# Patient Record
Sex: Female | Born: 1945
Health system: Western US, Academic
[De-identification: ages and names within clinical notes are randomized; demographics above are authoritative.]

---

## 2016-04-12 DIAGNOSIS — H15849 Scleral ectasia, unspecified eye: Secondary | ICD-10-CM

## 2016-04-12 DIAGNOSIS — Z9889 Other specified postprocedural states: Secondary | ICD-10-CM

## 2016-09-04 DIAGNOSIS — H159 Unspecified disorder of sclera: Secondary | ICD-10-CM

## 2016-09-18 ENCOUNTER — Ambulatory Visit: Payer: PRIVATE HEALTH INSURANCE

## 2016-10-04 ENCOUNTER — Ambulatory Visit: Payer: MEDICARE

## 2016-10-09 ENCOUNTER — Ambulatory Visit: Payer: PRIVATE HEALTH INSURANCE

## 2016-10-09 DIAGNOSIS — H11002 Unspecified pterygium of left eye: Secondary | ICD-10-CM

## 2016-10-22 ENCOUNTER — Ambulatory Visit: Payer: MEDICARE

## 2016-10-22 DIAGNOSIS — H119 Unspecified disorder of conjunctiva: Secondary | ICD-10-CM

## 2016-10-23 ENCOUNTER — Telehealth: Payer: MEDICARE

## 2016-11-01 ENCOUNTER — Ambulatory Visit: Payer: PRIVATE HEALTH INSURANCE

## 2016-11-01 DIAGNOSIS — H159 Unspecified disorder of sclera: Secondary | ICD-10-CM

## 2016-11-01 DIAGNOSIS — H15849 Scleral ectasia, unspecified eye: Secondary | ICD-10-CM

## 2016-11-02 ENCOUNTER — Ambulatory Visit: Payer: PRIVATE HEALTH INSURANCE

## 2016-11-15 ENCOUNTER — Ambulatory Visit: Payer: PRIVATE HEALTH INSURANCE

## 2016-11-15 DIAGNOSIS — H2513 Age-related nuclear cataract, bilateral: Secondary | ICD-10-CM

## 2016-11-15 MED ORDER — PREDNISOLONE ACETATE 1 % OP SUSP
2 refills | Status: AC
Start: 2016-11-15 — End: 2016-11-16

## 2016-11-15 MED ORDER — OFLOXACIN 0.3 % OP SOLN
1 [drp] | Freq: Four times a day (QID) | OPHTHALMIC | 3 refills | Status: AC
Start: 2016-11-15 — End: 2016-11-16

## 2016-11-15 MED ORDER — PREDNISOLONE ACETATE 1 % OP SUSP
2 refills | Status: AC
Start: 2016-11-15 — End: 2016-11-21

## 2016-11-15 MED ORDER — OFLOXACIN 0.3 % OP SOLN
1 [drp] | Freq: Four times a day (QID) | OPHTHALMIC | 3 refills | Status: AC
Start: 2016-11-15 — End: 2016-11-21

## 2016-11-20 ENCOUNTER — Inpatient Hospital Stay
Admit: 2016-11-20 | Discharge: 2016-11-20 | Disposition: A | Discharge status: Home / Self Care | Payer: PRIVATE HEALTH INSURANCE | Source: Home / Self Care

## 2016-11-20 DIAGNOSIS — H15849 Scleral ectasia, unspecified eye: Secondary | ICD-10-CM

## 2016-11-21 ENCOUNTER — Ambulatory Visit: Payer: MEDICARE

## 2016-11-21 MED ORDER — PREDNISOLONE ACETATE 1 % OP SUSP
1 [drp] | Freq: Three times a day (TID) | OPHTHALMIC | 2 refills
Start: 2016-11-21 — End: 2016-11-30

## 2016-11-21 MED ORDER — OFLOXACIN 0.3 % OP SOLN
1 [drp] | Freq: Three times a day (TID) | OPHTHALMIC | 3 refills
Start: 2016-11-21 — End: 2016-11-30

## 2016-11-29 ENCOUNTER — Ambulatory Visit: Payer: MEDICARE

## 2016-11-29 DIAGNOSIS — H029 Unspecified disorder of eyelid: Secondary | ICD-10-CM

## 2016-11-29 DIAGNOSIS — H2513 Age-related nuclear cataract, bilateral: Secondary | ICD-10-CM

## 2017-01-22 ENCOUNTER — Ambulatory Visit: Payer: MEDICARE

## 2017-02-12 ENCOUNTER — Ambulatory Visit: Payer: PRIVATE HEALTH INSURANCE

## 2017-02-12 DIAGNOSIS — H35363 Drusen (degenerative) of macula, bilateral: Secondary | ICD-10-CM

## 2017-05-23 ENCOUNTER — Ambulatory Visit: Payer: PRIVATE HEALTH INSURANCE

## 2017-06-20 ENCOUNTER — Ambulatory Visit: Payer: MEDICARE

## 2017-06-20 DIAGNOSIS — H35363 Drusen (degenerative) of macula, bilateral: Secondary | ICD-10-CM

## 2017-06-20 DIAGNOSIS — H2513 Age-related nuclear cataract, bilateral: Secondary | ICD-10-CM

## 2017-06-20 NOTE — Progress Notes
Assessment and Plan     Problem List:    Problem List        Eye / Vision Problems    1. Cataracts, bilateral     Overview      Borderline  -> monitor for now         2. Drusen (degenerative) of retina     Overview      Followed by Dr.Shah at [facility]         3. 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 Pterygium eye, left     Overview      May be cause of monocular diplopia however recommend RGP refraction   prior as any surgery may exacerbate scleritis. If vision does improve with   RGP, reasonable to assume pterygium is playing a role and may be removed   with conjunctival autograft. Avoid MMC, consider harvest inferiorly given   superotemp nodule of undetermined significance         5. 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 Right eye patch looks intact   although darkening adjacent is concerning for progressive scleromalacia.   No conj defect or exposure.   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 Ideally would like her to continue if tolerated  06/20/2017 remains on MTX, eyes appear quiet, followed by Dr.Kang at [facility]         6. Scleral lesion OS s/p biopsy 11/20/16     Overview      Scleral nodule of unclear etiology, nontender on palpation, minimally   inflammed.    Path report from biopsy 11/20/16 - Squamous mucosa with focal submucosal   lymphoplasmacytic chronic inflammation - Negative for lymphoproliferative process or malignancy, supported by   immunohistochemical stains   -> monitor               Medications:  Current Outpatient Prescriptions (Ophthalmic)   Medication Sig Dispense Refill   ??? carboxymethylcellulose (REFRESH PLUS) 0.5% ophthalmic solution 1 drop three (3) times daily as needed.     ??? Polyethyl Glycol-Propyl Glycol (SYSTANE OP) Place into both eyes daily as needed.       Current Outpatient Prescriptions (Other)   Medication Sig Dispense Refill   ??? lisinopril 10 mg tablet Take 10 mg by mouth daily.     ??? methotrexate 2.5 MG tablet Take 10 mg by mouth once a week.     ??? Multiple Vitamins-Minerals (EYE VITAMINS PO) Take by mouth daily.     ??? multivitamin tablet Take by mouth daily.     ??? vitamin A 45409 unit capsule Take 5,000 Units by mouth daily.            Follow ups:  Return in about 6 months (around 12/18/2017).     I discussed the above  assessment and plan with the patient. She had the opportunity to ask questions, and her questions and concerns were addressed. She was reminded to call if there is any significant change or worsening in vision, or to get an evaluation, urgently if appropriate.    Author:   Chace Bisch K. Al-Hashimi, MD  This note details the assessment and plan of the encounter for this date. For a complete note and record of the encounter, please see the Encounter Summary.

## 2017-12-12 ENCOUNTER — Ambulatory Visit: Payer: MEDICARE

## 2017-12-12 DIAGNOSIS — H35363 Drusen (degenerative) of macula, bilateral: Secondary | ICD-10-CM

## 2017-12-12 DIAGNOSIS — H2513 Age-related nuclear cataract, bilateral: Secondary | ICD-10-CM

## 2017-12-12 NOTE — Progress Notes
Assessment and Plan     Problem List:    Problem List        Eye / Vision Problems    1. Cataracts, bilateral     Overview      Borderline  -> monitor for now         2. Drusen (degenerative) of retina     Overview      Followed by Dr.Shah at [facility]         3. 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 History of pterygium excision OD    5. Pterygium eye, left     Overview      May be cause of monocular diplopia however recommend RGP refraction   prior as any surgery may exacerbate scleritis. If vision does improve with   RGP, reasonable to assume pterygium is playing a role and may be removed   with conjunctival autograft. Avoid MMC, consider harvest inferiorly given   superotemp nodule of undetermined significance         6. 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 Right eye patch looks intact   although darkening adjacent is concerning for progressive scleromalacia.   No conj defect or exposure.   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 Ideally would like her to continue if tolerated  06/20/2017 remains on MTX, eyes appear quiet, followed by Dr.Kang at [facility]  12/12/2017 as above, dealing with CME         7. Scleral lesion OS s/p biopsy 11/20/16     Overview      Scleral nodule of unclear etiology, nontender on palpation, minimally   inflammed.    Path report from biopsy 11/20/16 - Squamous mucosa with focal submucosal lymphoplasmacytic chronic inflammation  - Negative for lymphoproliferative process or malignancy, supported by   immunohistochemical stains   -> monitor               Medications:  Current Outpatient Prescriptions (Ophthalmic)   Medication Sig Dispense Refill   ??? carboxymethylcellulose (REFRESH PLUS) 0.5% ophthalmic solution 1 drop three (3) times daily as needed.     ??? Polyethyl Glycol-Propyl Glycol (SYSTANE OP) Place into both eyes daily as needed.       Current Outpatient Prescriptions (Other)   Medication Sig Dispense Refill   ??? folic acid 1 mg tablet Take 1 mg by mouth daily.     ??? lisinopril 10 mg tablet Take 10 mg by mouth daily.     ??? methotrexate 2.5 MG tablet Take 10 mg by mouth once a week.     ??? Multiple Vitamins-Minerals (EYE VITAMINS PO) Take by mouth daily.     ??? multivitamin tablet Take by mouth daily.     ??? vitamin A 16109 unit capsule Take 5,000 Units by  mouth daily.            Follow ups:  Return in about 6 months (around 06/13/2018).     I discussed the above assessment and plan with the patient. She had the opportunity to ask questions, and her questions and concerns were addressed. She was reminded to call if there is any significant change or worsening in vision, or to get an evaluation, urgently if appropriate.    Author:   Judith Part. Al-Hashimi, MD  This note details the assessment and plan of the encounter for this date. For a complete note and record of the encounter, please see the Encounter Summary.

## 2018-06-19 ENCOUNTER — Ambulatory Visit: Payer: PRIVATE HEALTH INSURANCE

## 2018-11-25 ENCOUNTER — Ambulatory Visit: Payer: MEDICARE

## 2020-05-27 IMAGING — DX SKELETAL SURVEY
1 series · 8 of 10 positions shown · non-contrast
Comparison: None

SKELETAL SURVEY, 05/27/2020 [DATE]: 
CLINICAL INDICATION: MGUS.

[Series 1: lateral · U · 0.14mm/px · 8 of 16 slices shown]
[im 1/16]
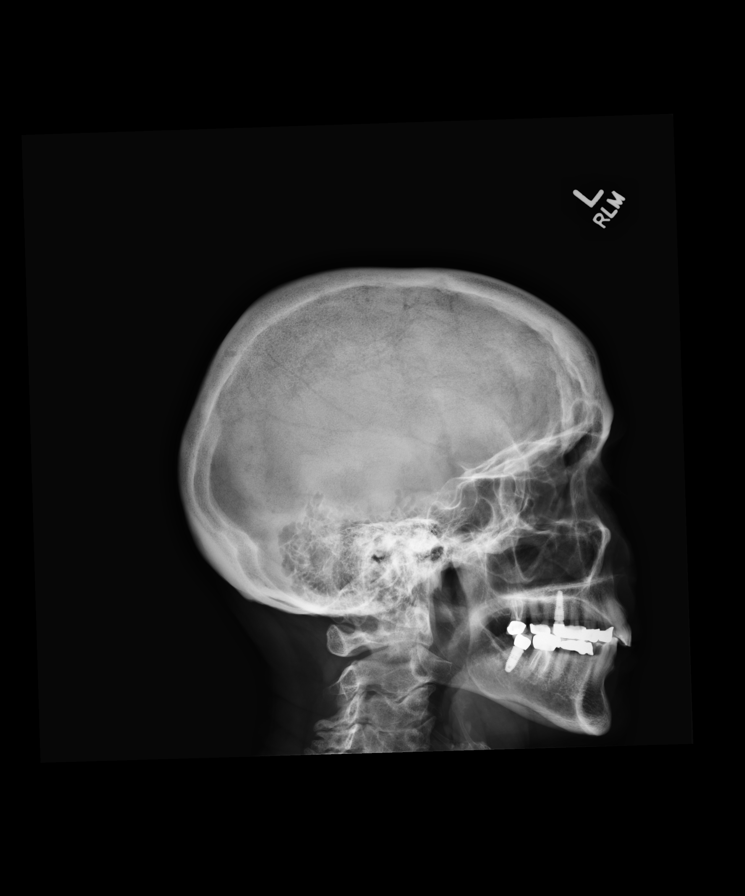
[im 2/16]
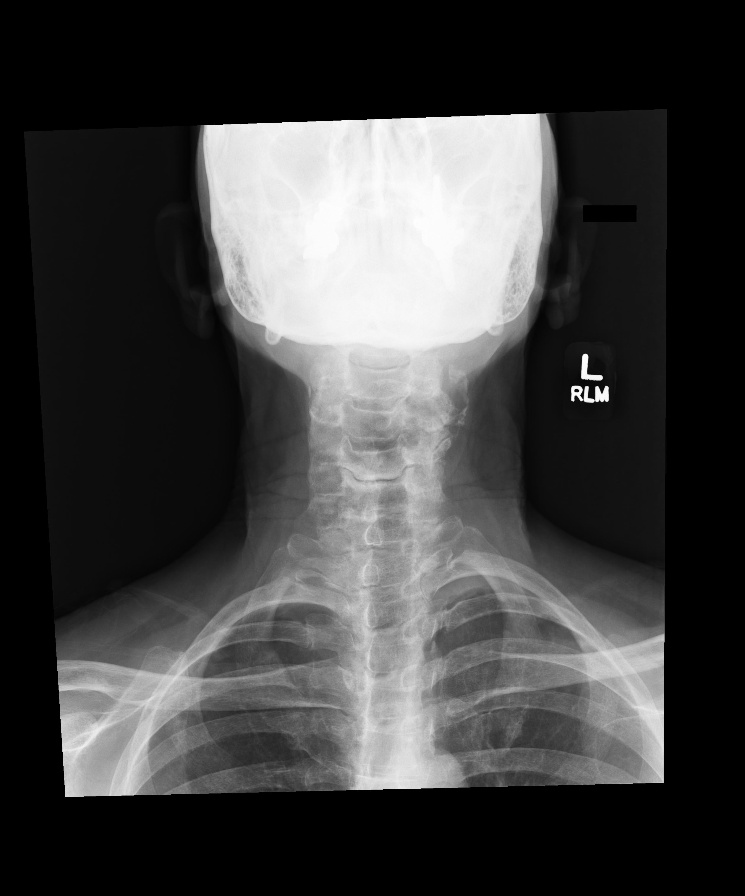
[im 4/16]
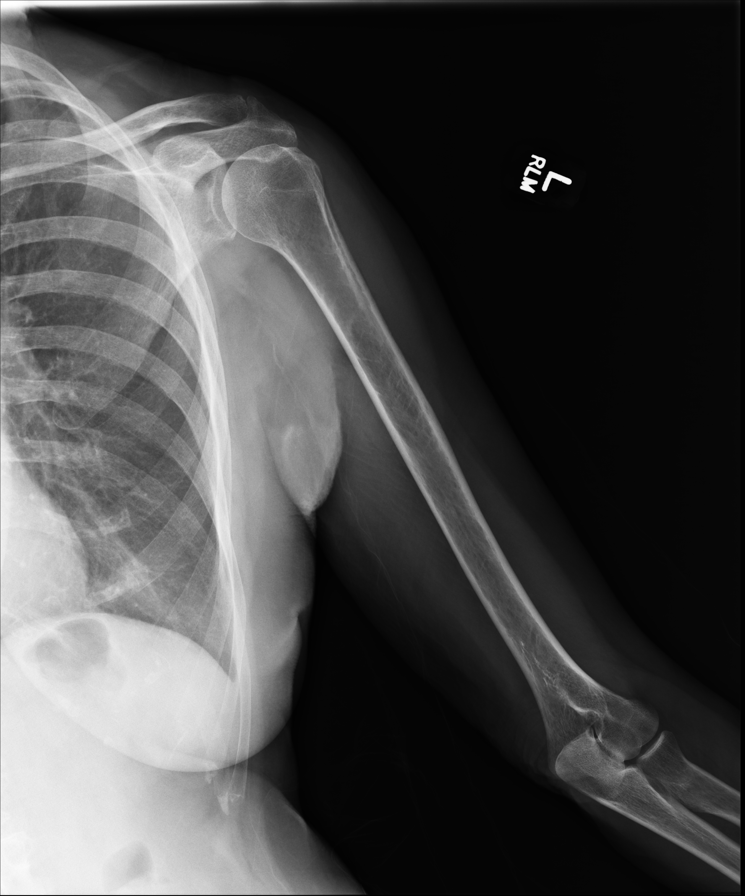
[im 6/16]
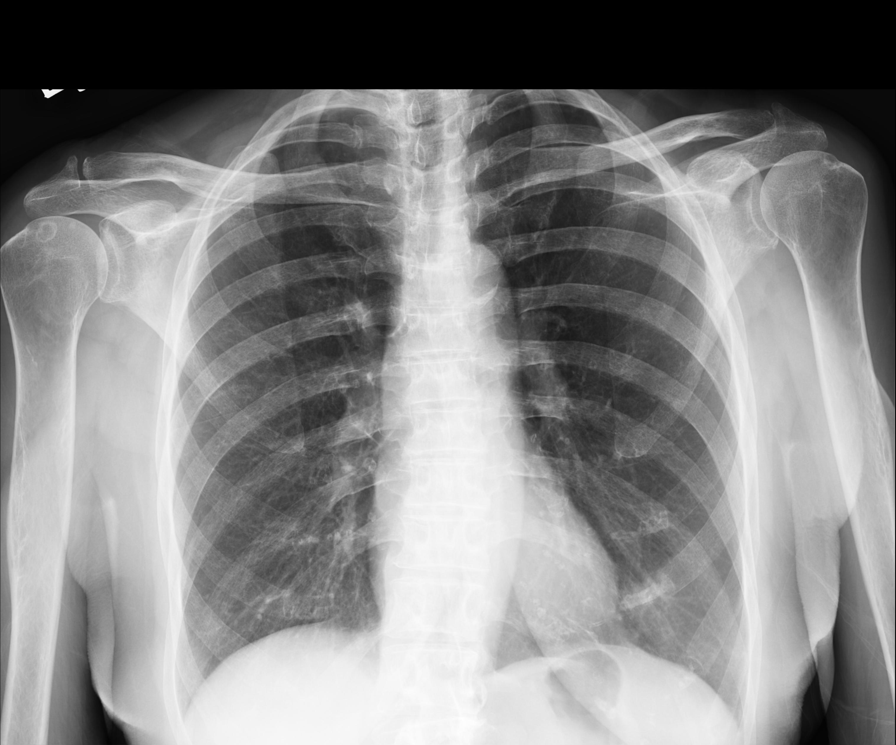
[im 7/16]
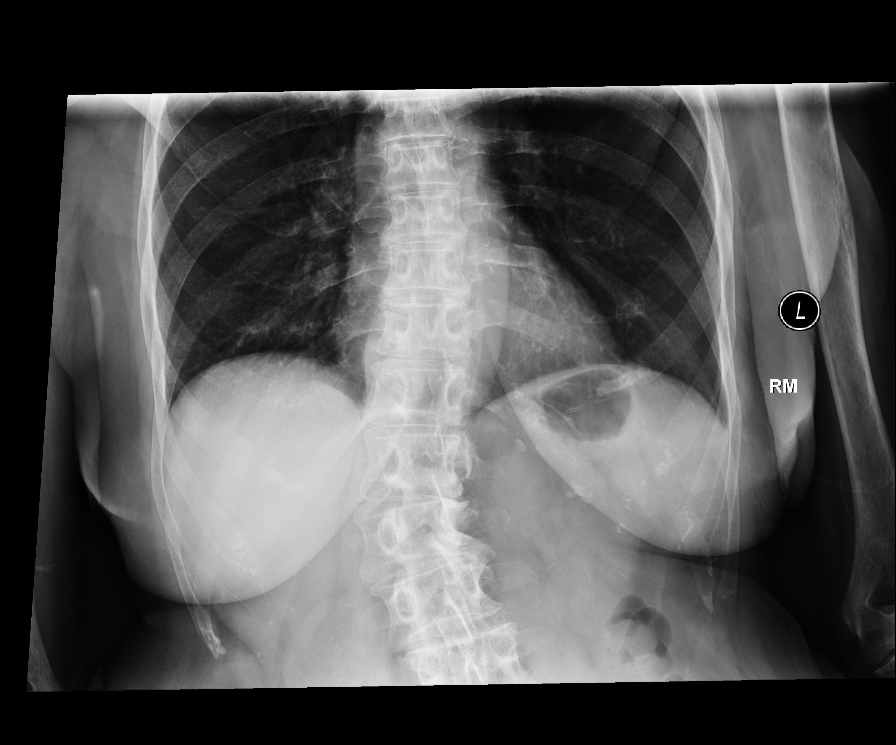
[im 9/16]
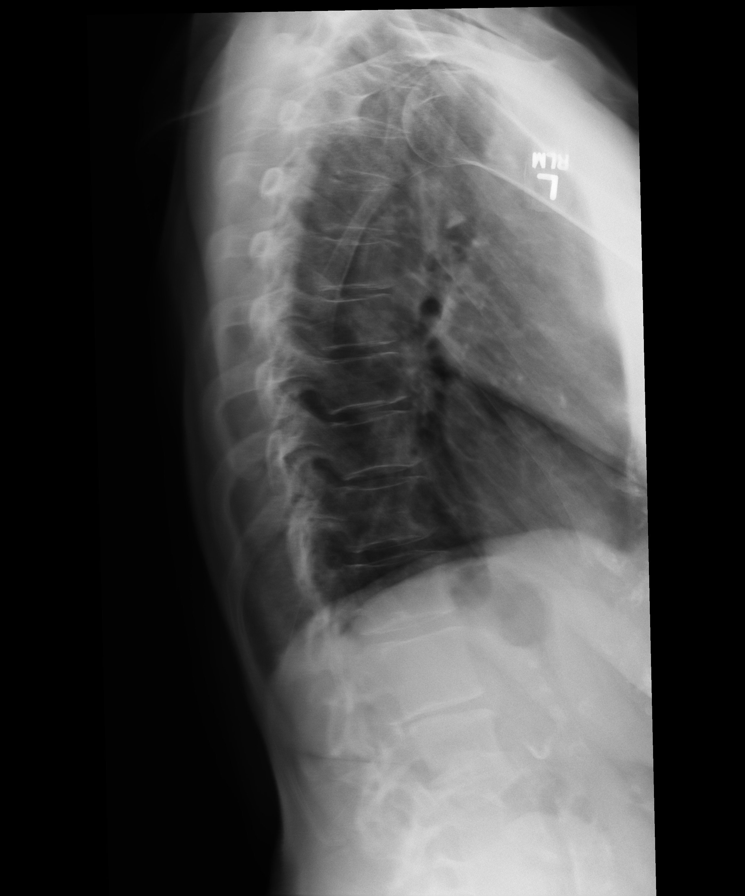
[im 11/16]
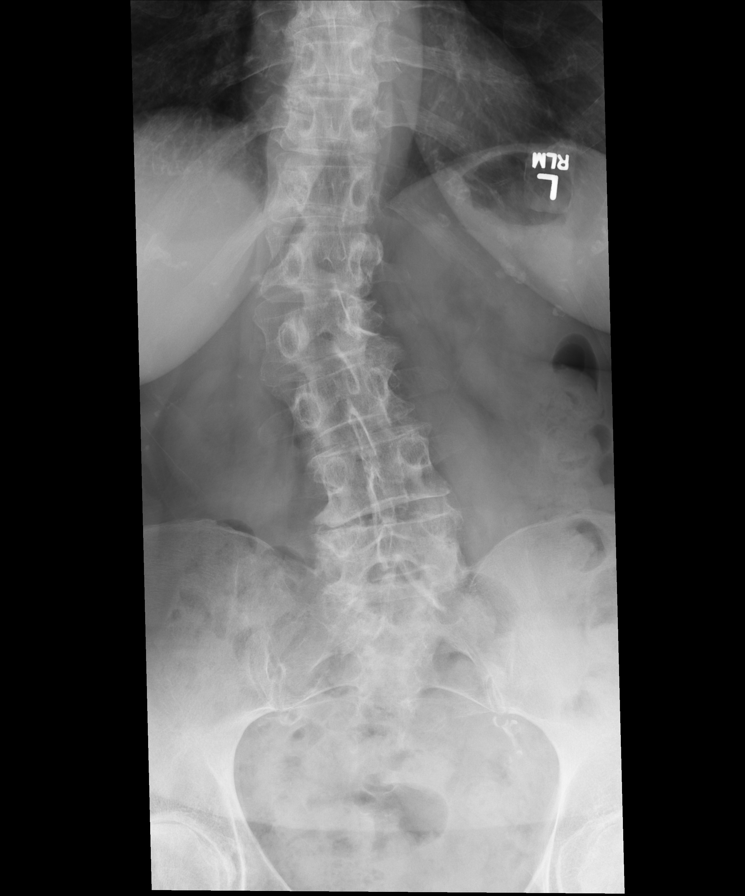
[im 12/16]
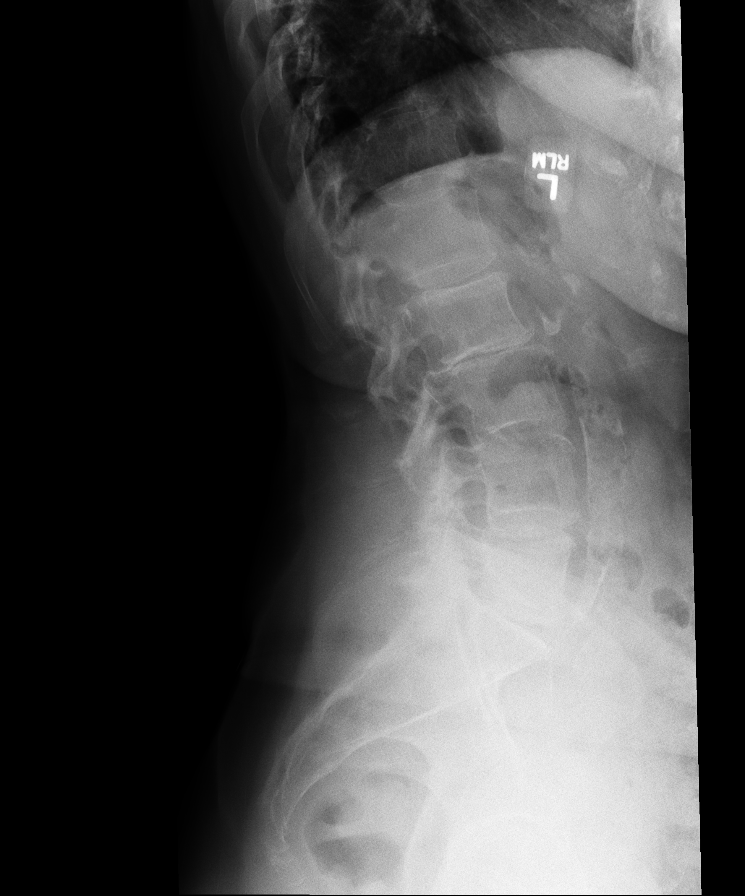

[8 of 10 positions shown; findings below may reference images not displayed]

FINDINGS: No lytic or blastic lesions. Multifocal degenerative change. Mild thoracolumbar 
rotatory dextroscoliosis. Left cemented total knee arthroplasty. 
Atherosclerosis.
IMPRESSION: No lytic or blastic lesions.

## 2020-09-23 IMAGING — CT CT CHEST WITH CONTRAST
2 of 3 series · 15 of 36 positions shown, 18 images · IV contrast (ISOVUE 300)
Comparison: None

CT CHEST WITH CONTRAST, 09/23/2020 [DATE]: 
CLINICAL INDICATION:  COPD, dyspnea 
A search for DICOM formatted images was conducted for prior CT imaging studies 
completed at a non-affiliated media free facility.
TECHNIQUE: The chest was scanned from base of neck through the lung bases with 
100 mL of Isovue 300 injected intravenously on a high resolution low dose CT 
scanner. Routine MPR and MIP 3D renderings were reconstructed on an independent 
workstation with concurrent physician supervision.

[Series 4: chest 2.0 i31s 3 · axial · 0.78mm/px · z∈[-314,-10]mm · 12 of 180 slices shown, 15 images]
[im 14/180  mediastinal]
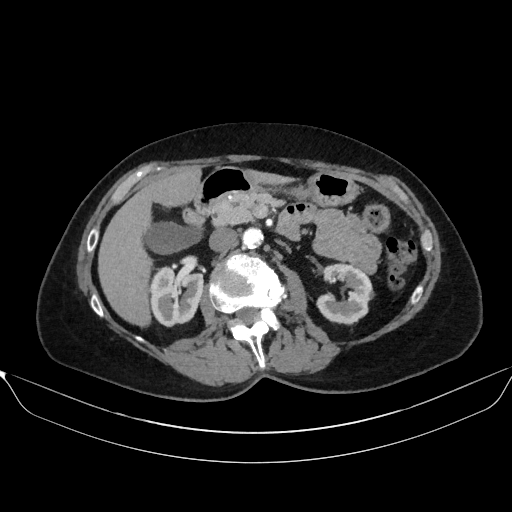
[im 14/180  lung]
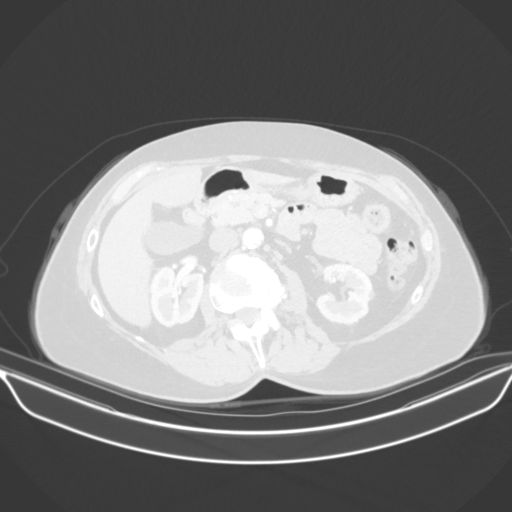
[im 27/180  lung]
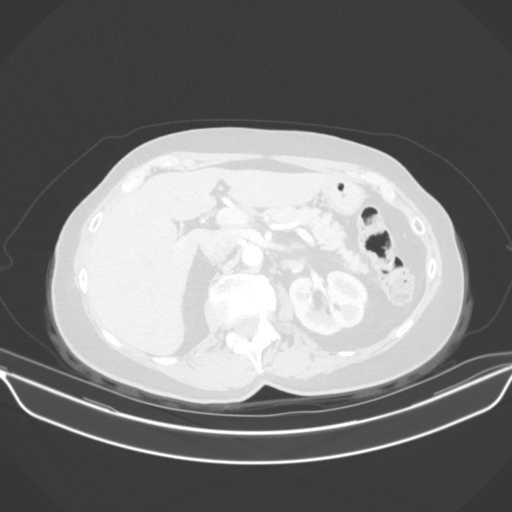
[im 40/180  lung]
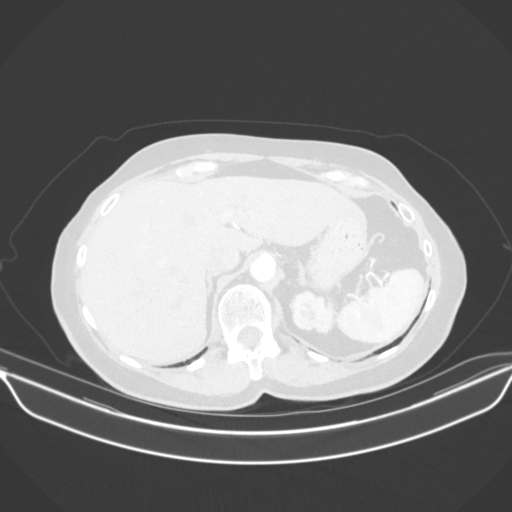
[im 54/180  lung]
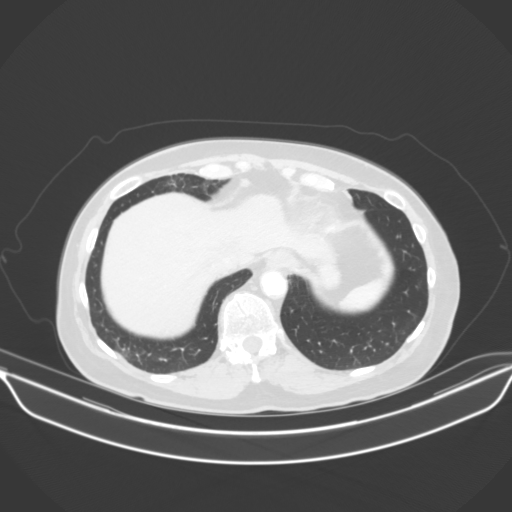
[im 67/180  mediastinal]
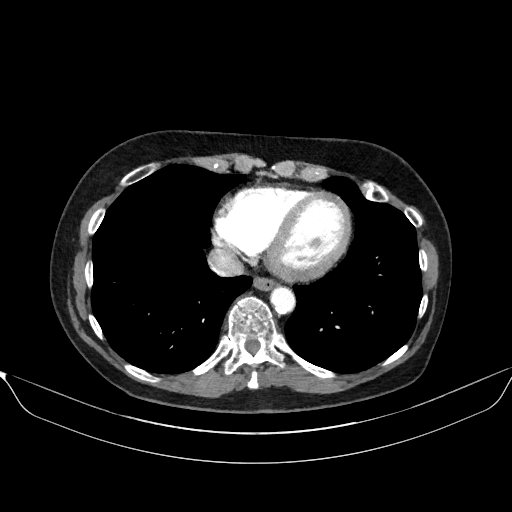
[im 67/180  lung]
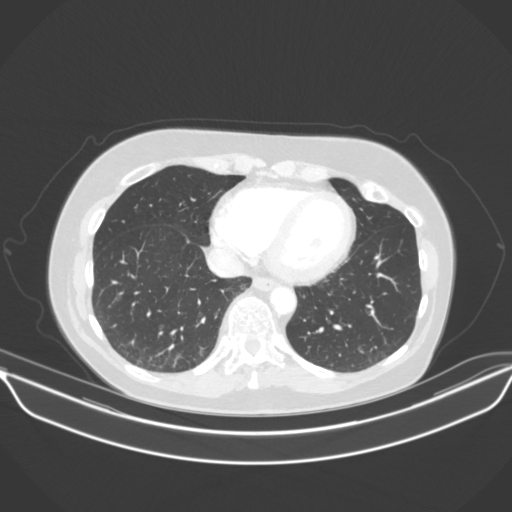
[im 80/180  lung]
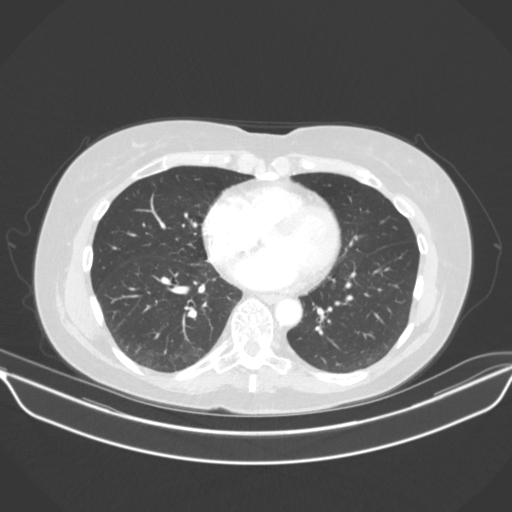
[im 100/180  lung]
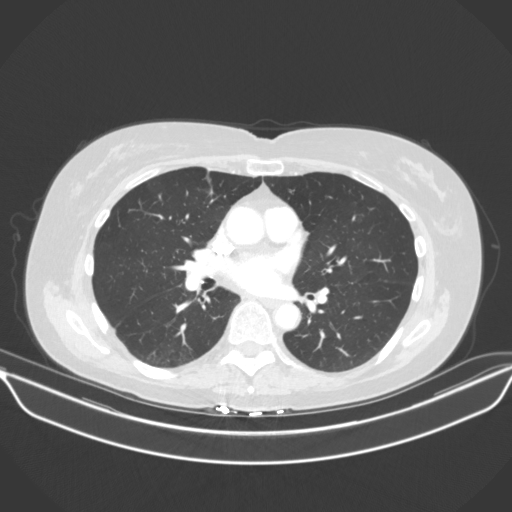
[im 113/180  lung]
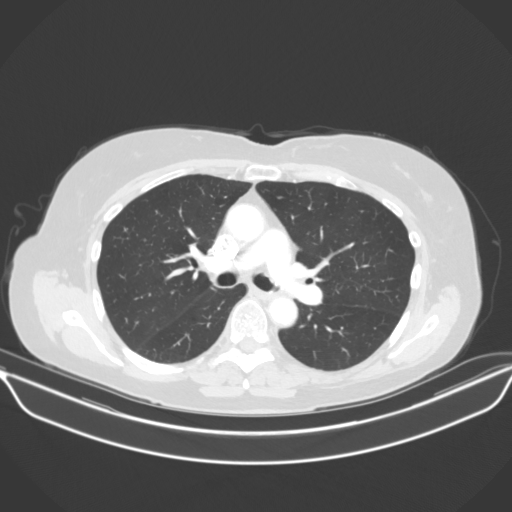
[im 126/180  mediastinal]
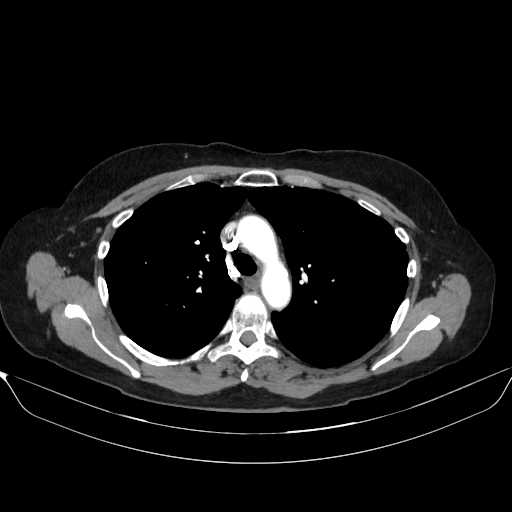
[im 126/180  lung]
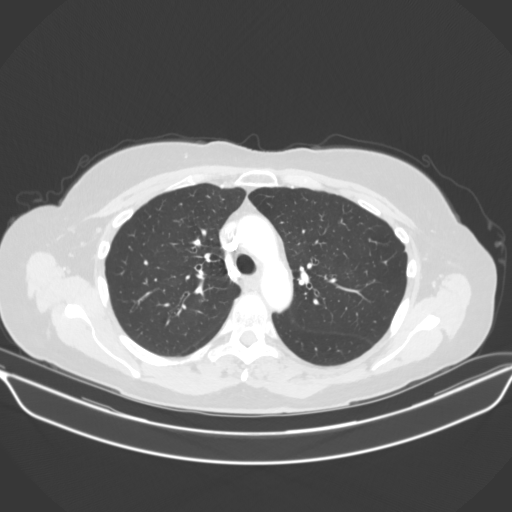
[im 140/180  lung]
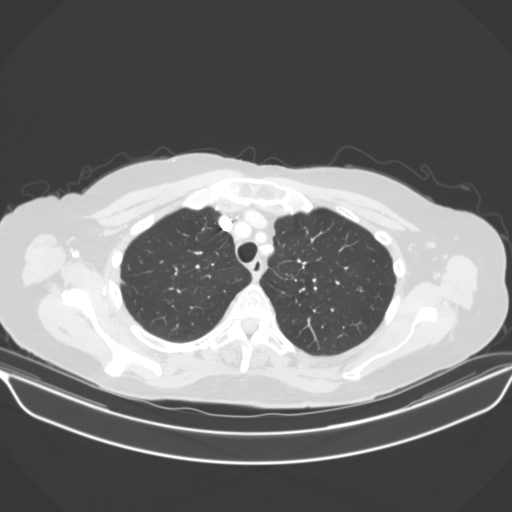
[im 153/180  lung]
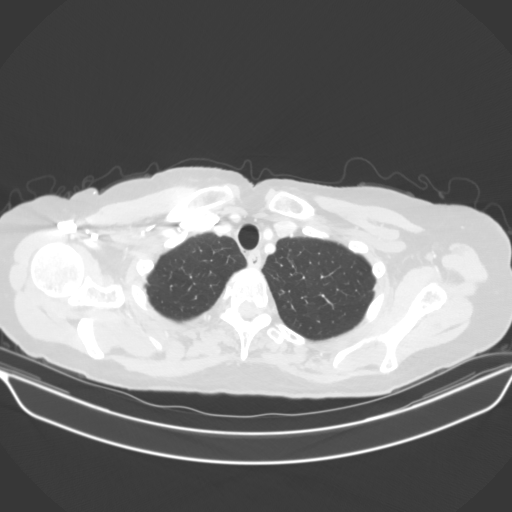
[im 166/180  lung]
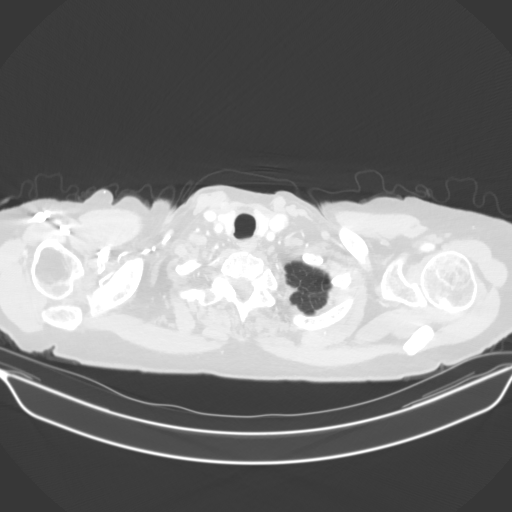

[Series 7: coronal · coronal · 0.70mm/px · 3 of 104 slices shown]
[im 21/104  lung]
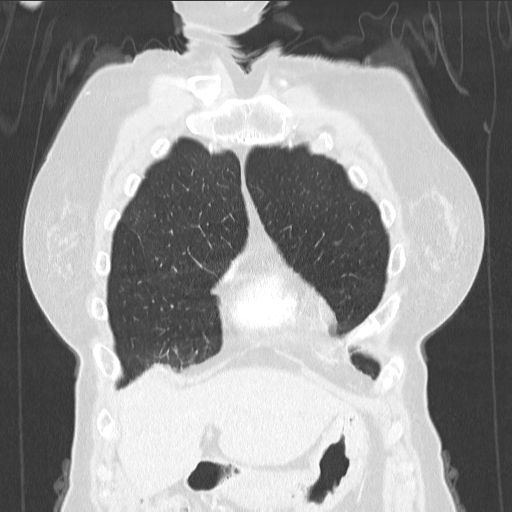
[im 42/104  lung]
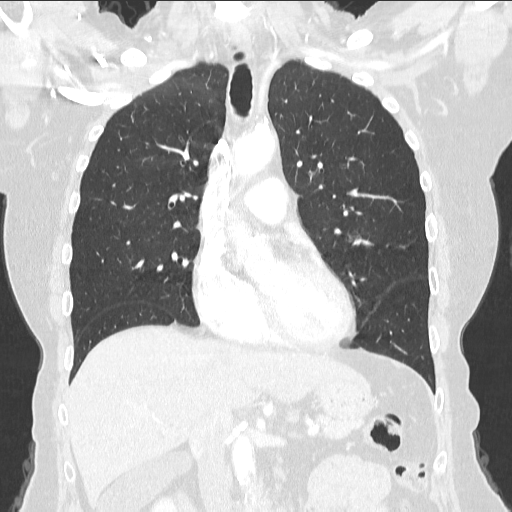
[im 62/104  lung]
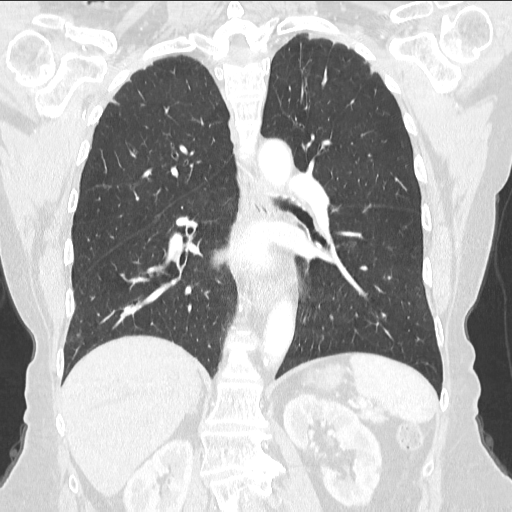

[15 of 36 positions shown; findings below may reference images not displayed]

FINDINGS: LUNGS AND PLEURA:  Mild dependent atelectasis. Few scattered micronodules. None 
greater than 2 to 3 mm. Thought to be incidental. Mild emphysematous changes. No 
pleural effusion or suspicious mass. 
MEDIASTINUM:  Moderate to marked coronary calcifications. No adenopathy 
identified. 
CHEST WALL/AXILLA: No mass or adenopathy. 
UPPER ABDOMEN: Negative. 
MUSCULOSKELETAL: Endplate changes. No fracture or destructive changes seen.
IMPRESSION: Atherosclerotic changes, mild emphysematous changes and degenerative changes. 
Mild dependent atelectasis. 
RADIATION DOSE REDUCTION: All CT scans are performed using radiation dose 
reduction techniques, when applicable.  Technical factors are evaluated and 
adjusted to ensure appropriate moderation of exposure.  Automated dose 
management technology is applied to adjust the radiation doses to minimize 
exposure while achieving diagnostic quality images.

## 2021-02-14 IMAGING — CT CT CALCIUM SCORING
1 series · 15 of 20 positions shown, 19 images · non-contrast
Comparison: 

________________________________________________________________________________________________ 
CT CALCIUM SCORING, 02/14/2021 [DATE]:
INDICATION: Evaluate coronary artery calcification.

[Series 2: cascoreseq 3.0 b35s 60% · axial · 0.37mm/px · z∈[-321,-168]mm · 15 of 114 slices shown, 19 images]
[im 6/114  vessel]
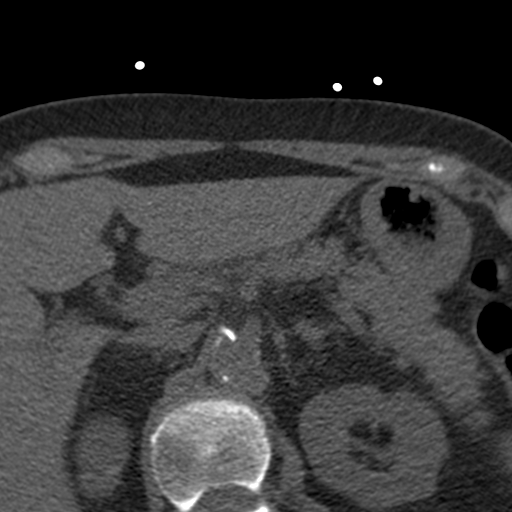
[im 6/114  lung]
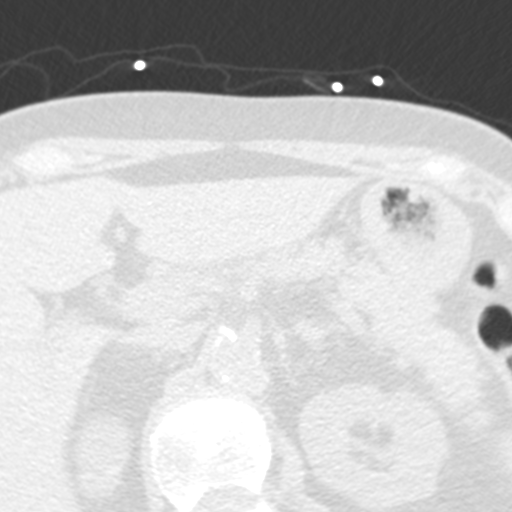
[im 12/114  vessel]
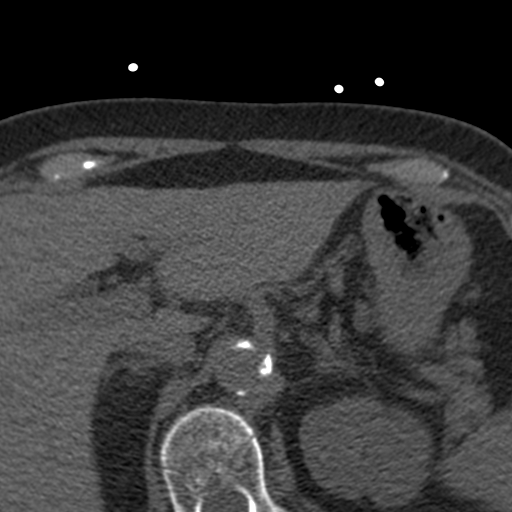
[im 24/114  vessel]
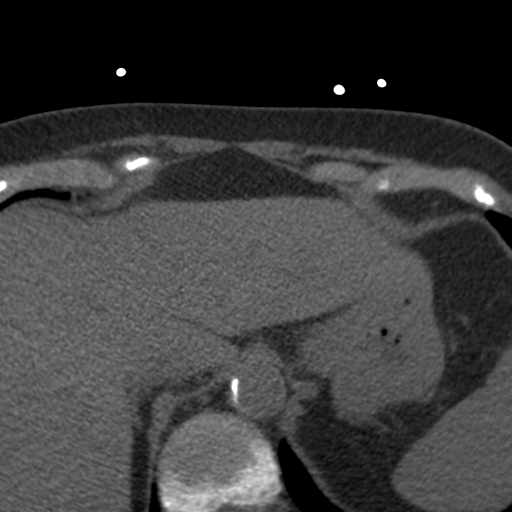
[im 30/114  vessel]
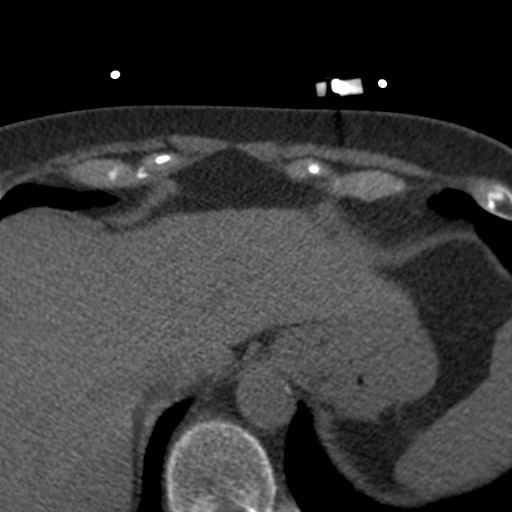
[im 36/114  vessel]
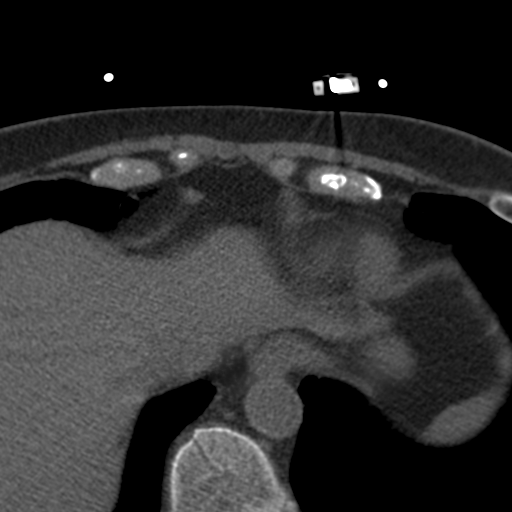
[im 36/114  lung]
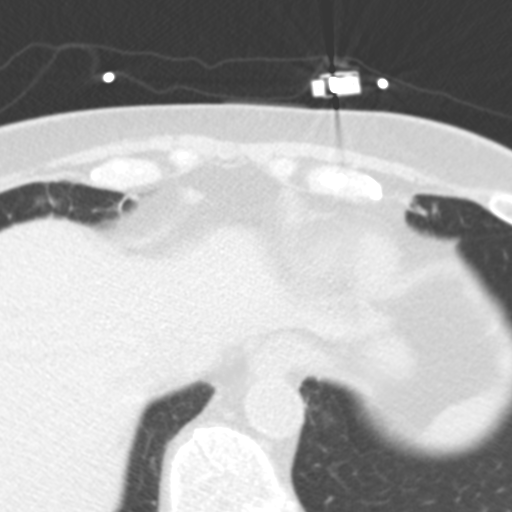
[im 42/114  vessel]
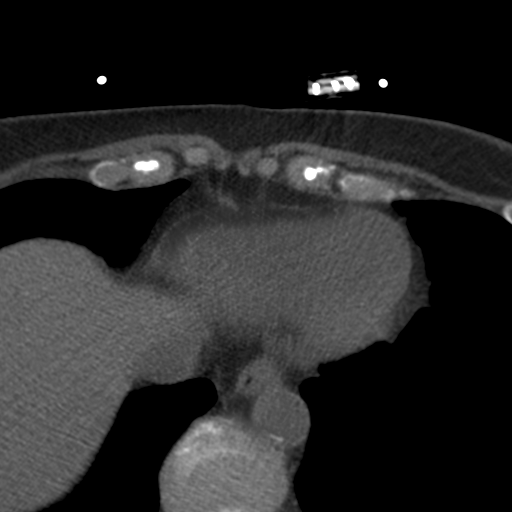
[im 48/114  vessel]
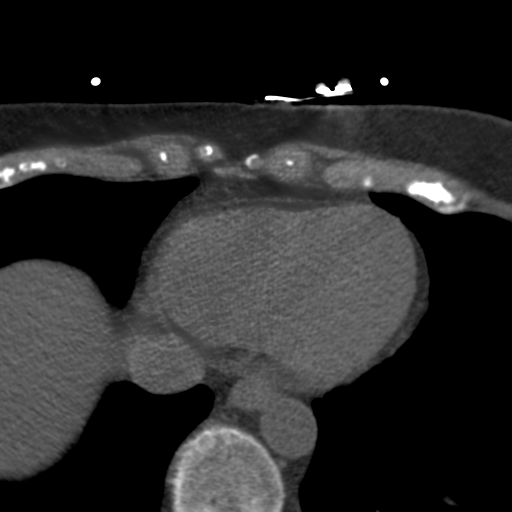
[im 60/114  vessel]
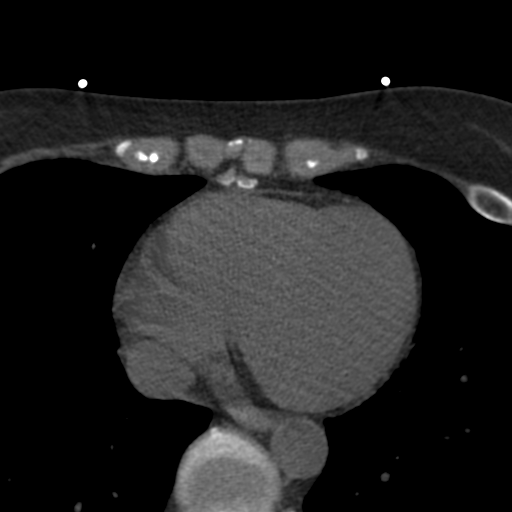
[im 66/114  vessel]
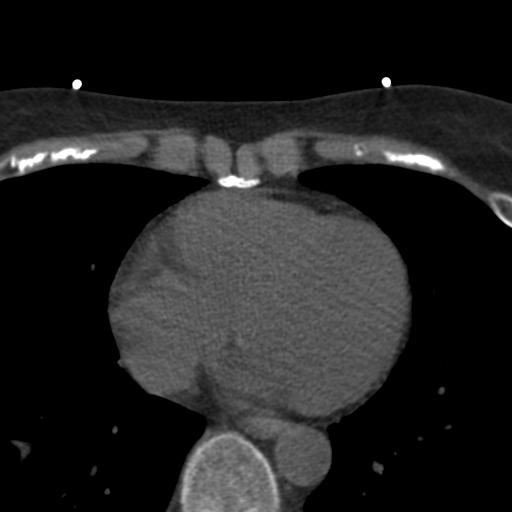
[im 66/114  lung]
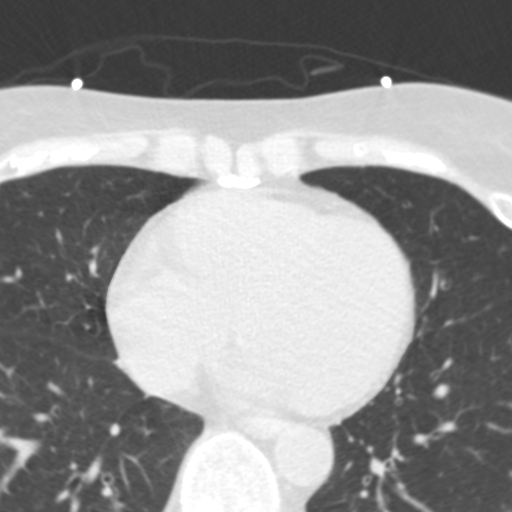
[im 72/114  vessel]
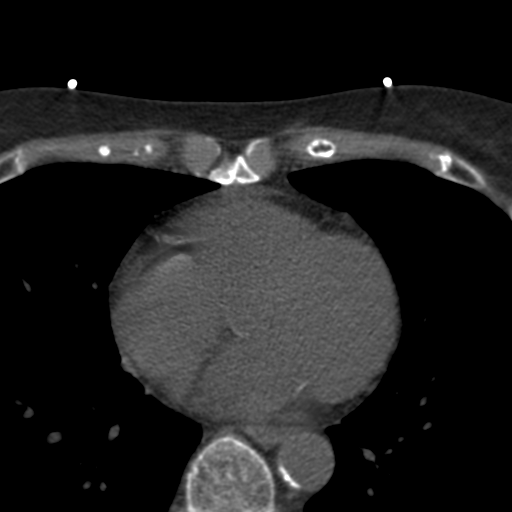
[im 78/114  vessel]
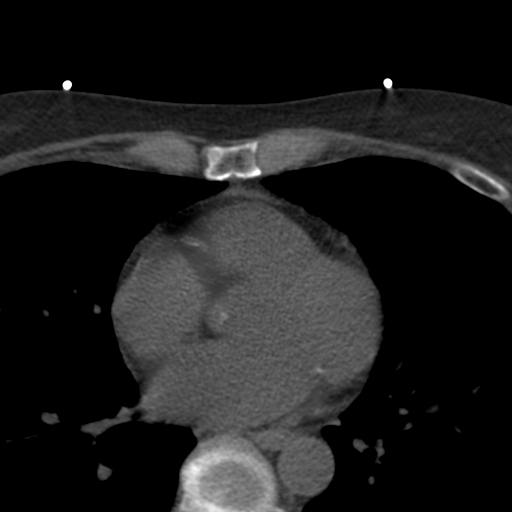
[im 84/114  vessel]
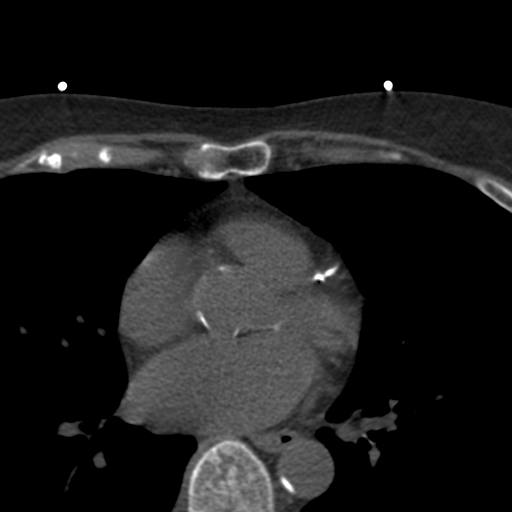
[im 96/114  vessel]
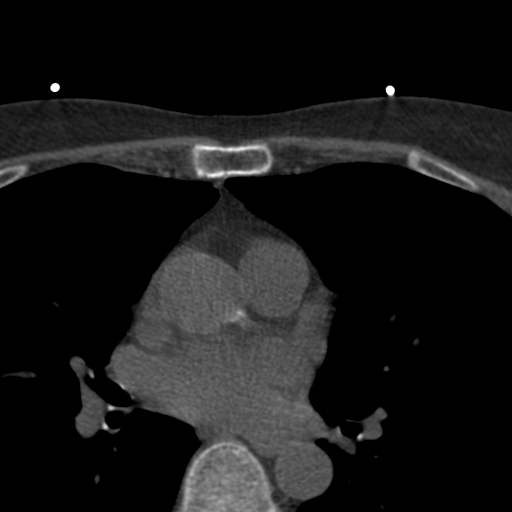
[im 96/114  lung]
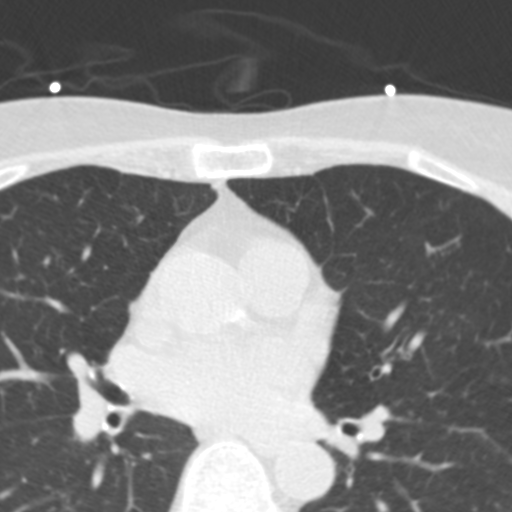
[im 102/114  vessel]
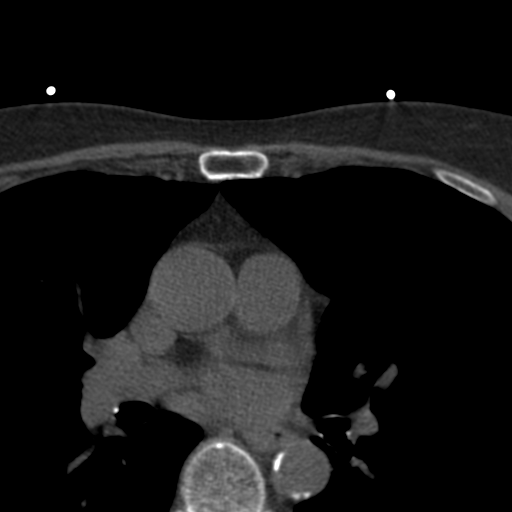
[im 108/114  vessel]
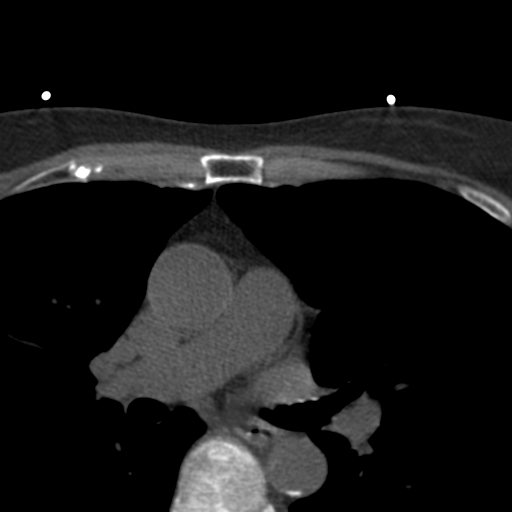

[15 of 20 positions shown; findings below may reference images not displayed]

FINDINGS: No focal lung pathology on limited evaluation. There is mild bronchial wall 
thickening.
IMPRESSION: Total Calcium Score: 399 
Mild bronchial wall thickening. 
Calcium score                                     Presence of Plaque 
0                                                    No evidence of plaque 
1-10                                               Minimal evidence of plaque 
11-100                                            Mild evidence of plaque 
101-400                                          Moderate evidence of plaque 
Over 400                                        Extensive evidence of plaque     

Calcium scoring sheets to follow. 
RADIATION DOSE REDUCTION: All CT scans are performed using radiation dose 
reduction techniques, when applicable.  Technical factors are evaluated and 
adjusted to ensure appropriate moderation of exposure.  Automated dose 
management technology is applied to adjust the radiation doses to minimize 
exposure while achieving diagnostic quality images.

## 2021-07-08 IMAGING — MR MRI RIGHT HIP WITHOUT CONTRAST
4 of 6 series · 20 of 40 positions shown · IV contrast (gadolinium)
Comparison: 05/27/2020 radiographs

________________________________________________________________________________________________ 
MRI RIGHT HIP WITHOUT CONTRAST, 07/08/2021 [DATE]: 
CLINICAL INDICATION: Abductor cuffs tear. Right hip pain for 15 years.
TECHNIQUE: Multiplanar, multiecho position MR images of the hip were performed 
without intravenous gadolinium enhancement. Large field-of-view images were 
performed of the pelvis to include the contralateral hip for comparison. Patient 
was scanned on a 3T magnet.

[Series 101: survey · axial · 15.0mm · 1.76mm/px · z∈[-14,+224]mm · 3 of 18 slices shown]
[im 1/18]
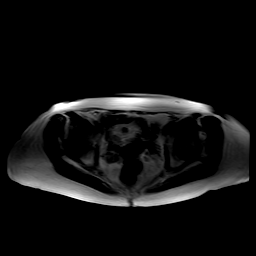
[im 12/18]
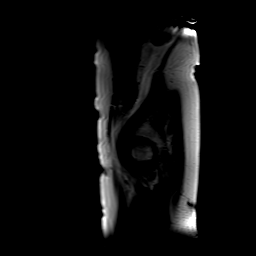
[im 18/18]
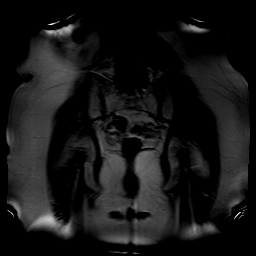

[Series 401: PD · sagittal · 3.5mm · 0.43mm/px · 5 of 36 slices shown]
[im 1/36]
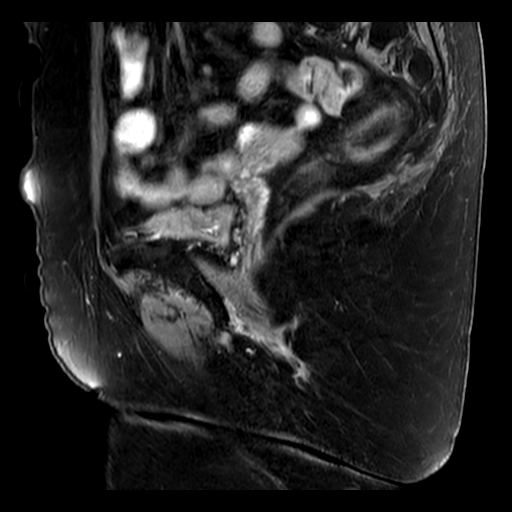
[im 6/36]
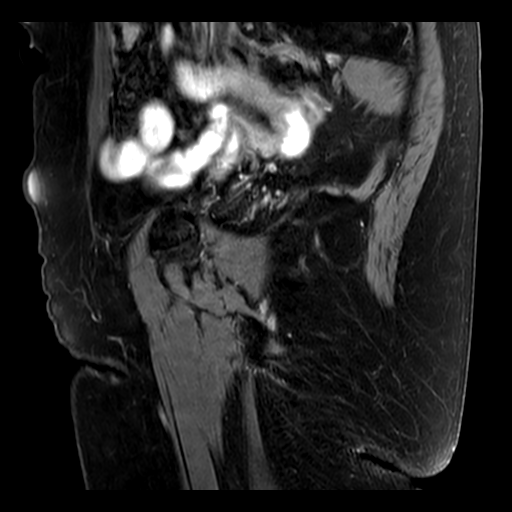
[im 11/36]
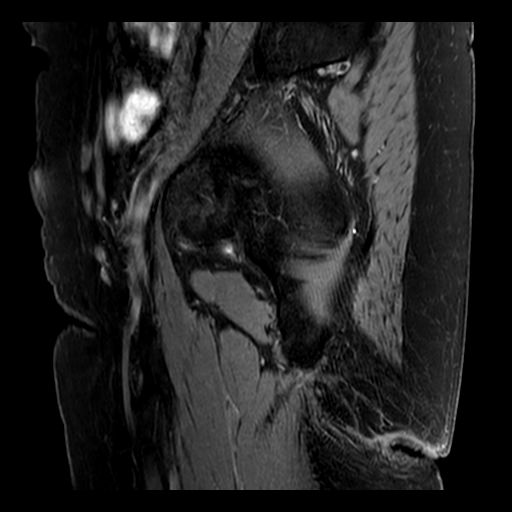
[im 21/36]
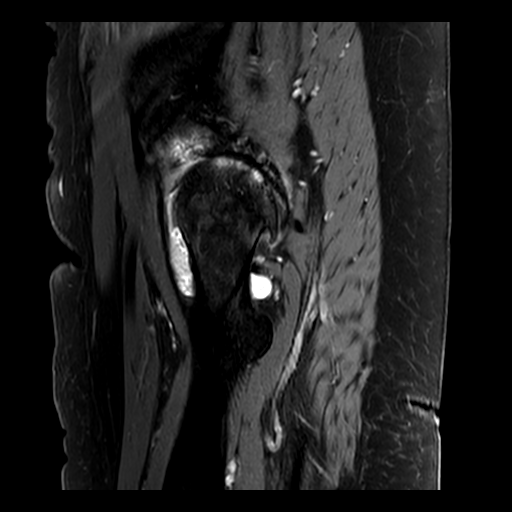
[im 31/36]
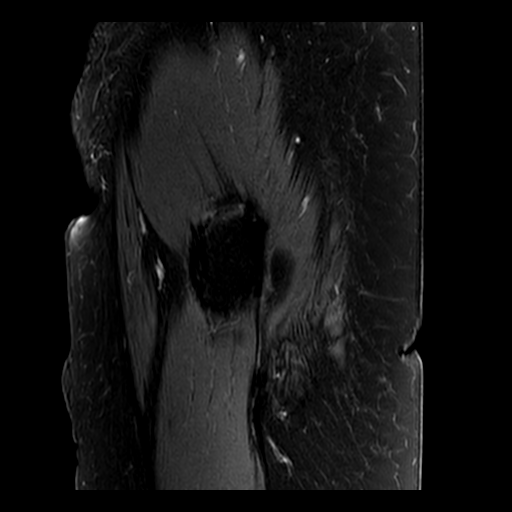

[Series 501: PD fat-sat · axial · 4.0mm · 0.35mm/px · z∈[-132,+10]mm · 7 of 32 slices shown (1 of 2)]
[im 1/32]
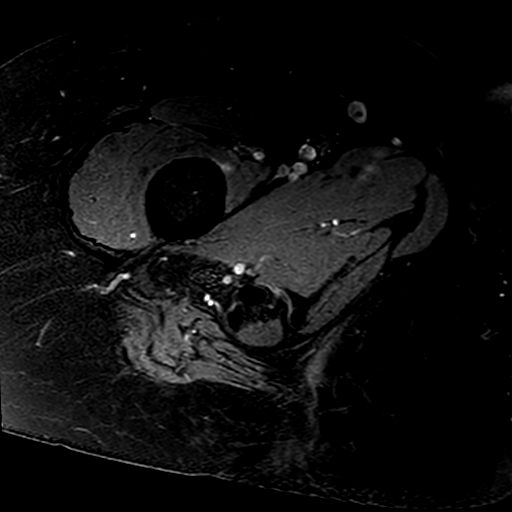
[im 6/32]
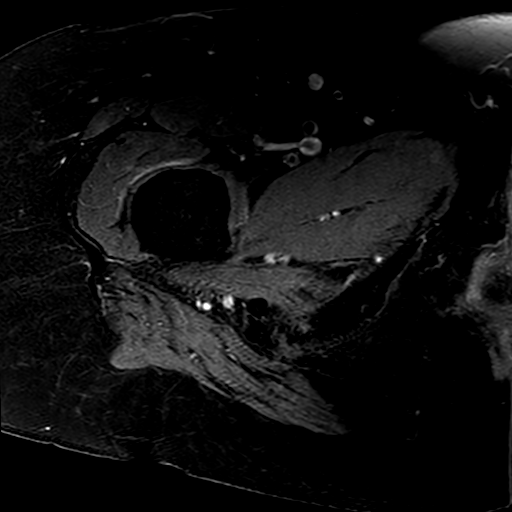
[im 11/32]
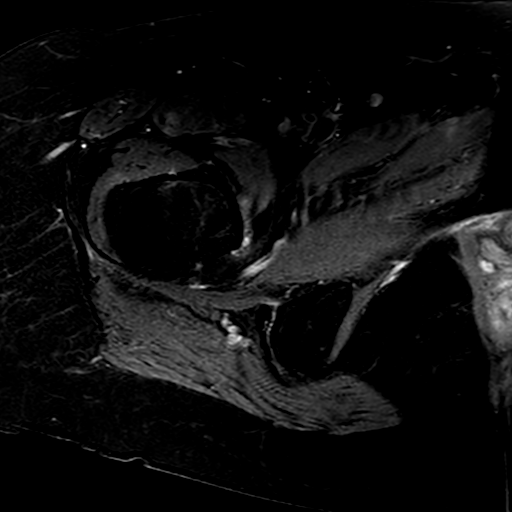
[im 16/32]
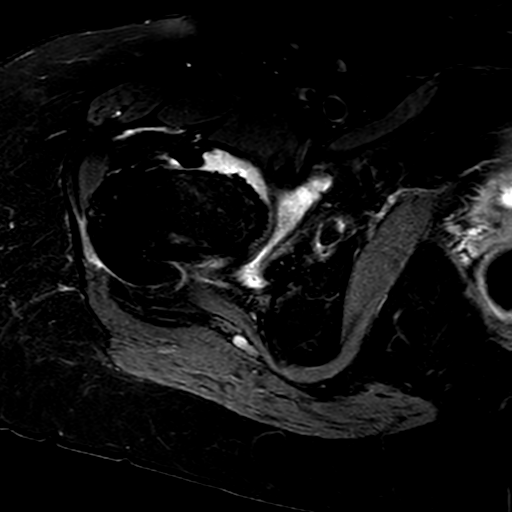
[im 21/32]
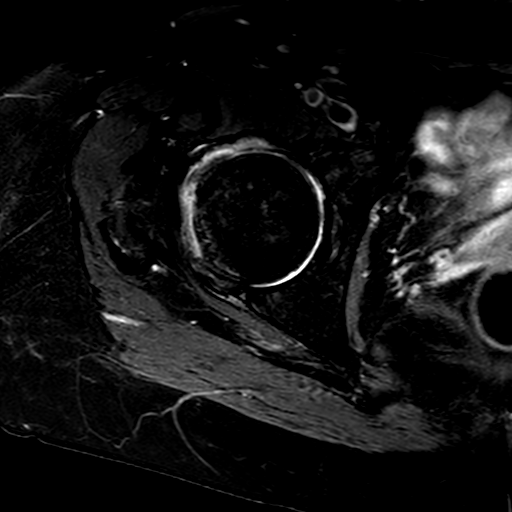
[im 26/32]
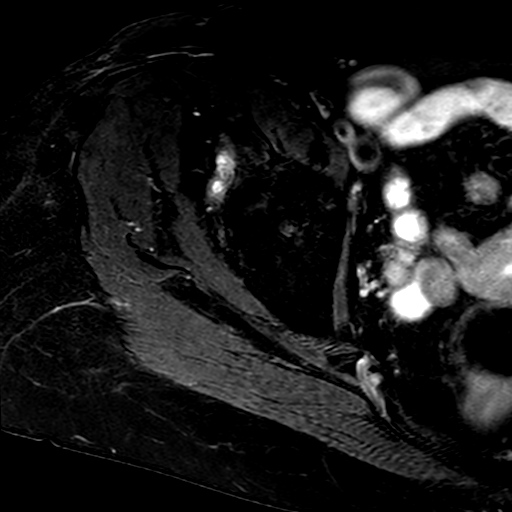
[im 32/32]
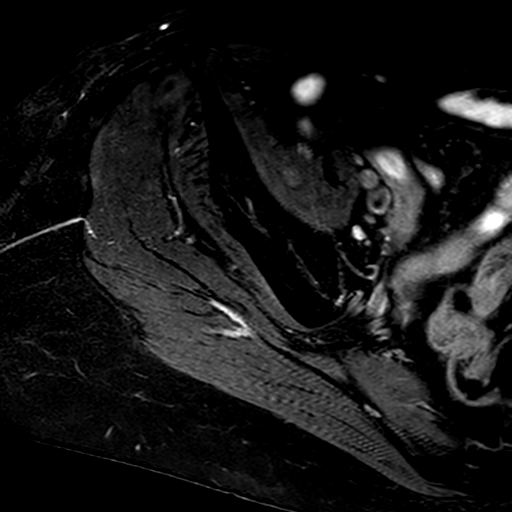

[Series 601: PD fat-sat · coronal · 3.2mm · 0.43mm/px · 5 of 20 slices shown (2 of 2)]
[im 1/20]
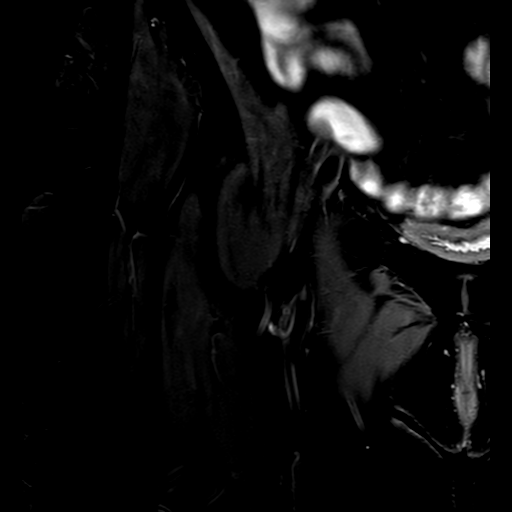
[im 5/20]
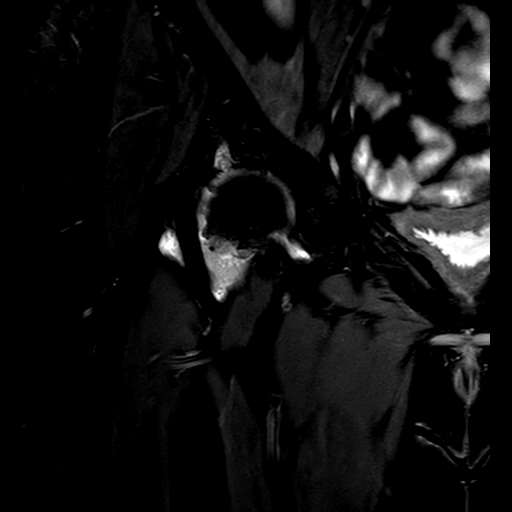
[im 10/20]
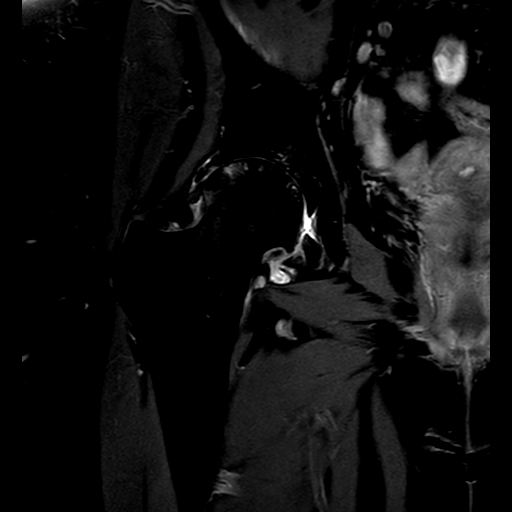
[im 15/20]
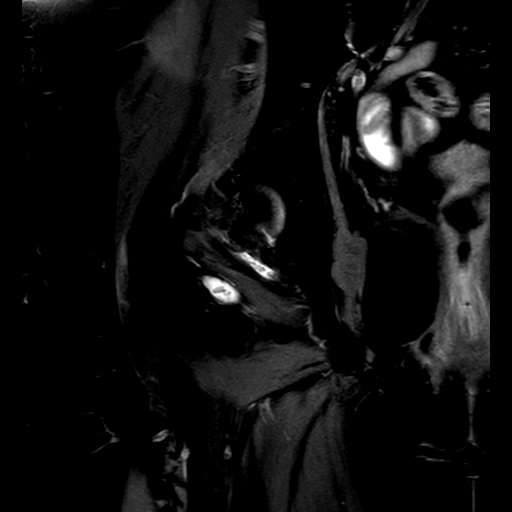
[im 20/20]
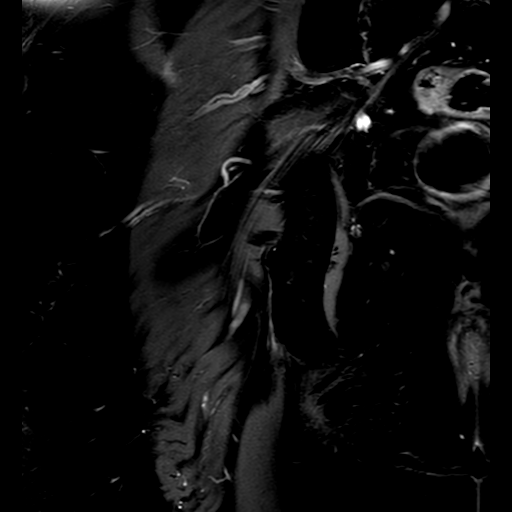

[20 of 40 positions shown; findings below may reference images not displayed]

FINDINGS: HIPS:  Marked degenerative change of the right hip with full-thickness 
chondromalacia, small subcortical cysts, marginal osteophytes, moderate joint 
effusion and synovitis. Multifocal degenerative right labral tears. Mild 
degenerative change of the left hip with partial thickness chondromalacia, 
degenerative labral tears and anterior paralabral cyst. Both femoral heads 
maintain a spherical configuration without evidence of avascular necrosis or 
subarticular collapse. No abnormal morphology of the proximal femurs or 
acetabulum to predispose to impingement.  
BONES: Normal marrow signal intensity of the proximal hips, pelvis, sacrum and 
included lower lumbar spine. No fracture, contusion or marrow replacing lesion. 
Included lumbar spine demonstrates multilevel degenerative change and facet 
joint synovial cysts. SI joints show mild degenerative change. 
SOFT TISSUES: Mild bilateral gluteus minimus tendinosis and minimal bilateral 
peritendinous and left peritrochanteric fluid. The insertions of the iliopsoas 
tendons are intact. Low-grade partial-thickness tears of the right hamstring 
tendon origin. The origin of the left hamstring is preserved. Rectus 
femoris-adductor complex is preserved. Minimal nonspecific fluid deep to the 
iliacus muscles. The musculature is symmetric without strain, atrophy or mass. 
No focal fluid collection or distended bursa. Specifically, no iliopsoas or 
trochanteric bursitis. Included neurovascular bundles are negative. Colonic 
diverticulosis.
IMPRESSION: 1.  Mild bilateral gluteus minimus tendinosis and minimal bilateral 
peritendinous and left peritrochanteric fluid.  
2.  Marked degenerative change of the right hip, moderate joint effusion, 
synovitis and degenerative right labral tears.  
3.  Low-grade partial-thickness tears of the right hamstring tendon origin. 
4.  Mild degenerative change of the left hip, degenerative labral tears and 
anterior paralabral cyst. 
5.  Colonic diverticulosis.

## 2022-06-03 IMAGING — MR MRI THORACIC SPINE WITHOUT CONTRAST
5 of 9 series · 11 of 48 positions shown · IV contrast (gadolinium)
Comparison: None.

________________________________________________________________________________________________ 
MRI THORACIC SPINE WITHOUT CONTRAST, 06/03/2022 [DATE]: 
CLINICAL INDICATION: Sharp mid back pain.
TECHNIQUE: Multiplanar, multiecho position MR images of the thoracic spine were 
performed without intravenous gadolinium enhancement. Patient was scanned on a 
1.5T magnet.

[Series 101: t2_sag_count · sagittal · 4.0mm · 0.62mm/px · 4 of 22 slices shown]
[im 1/22]
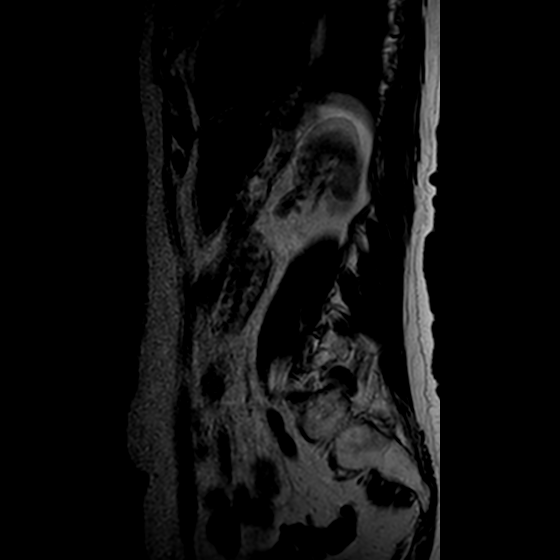
[im 8/22]
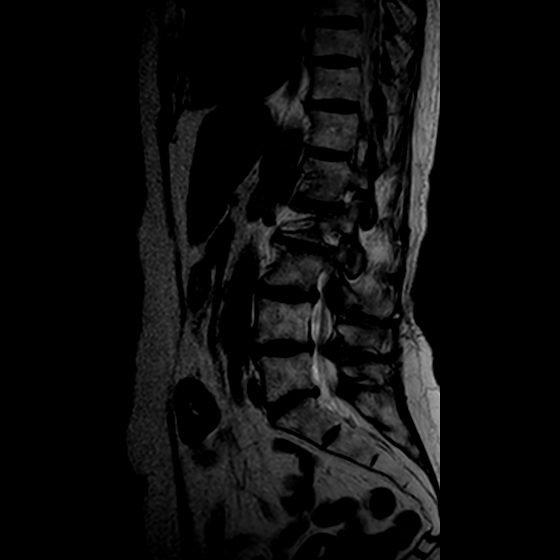
[im 15/22]
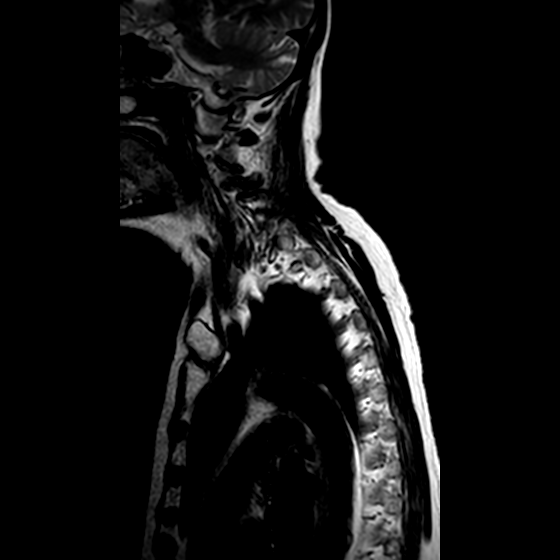
[im 22/22]
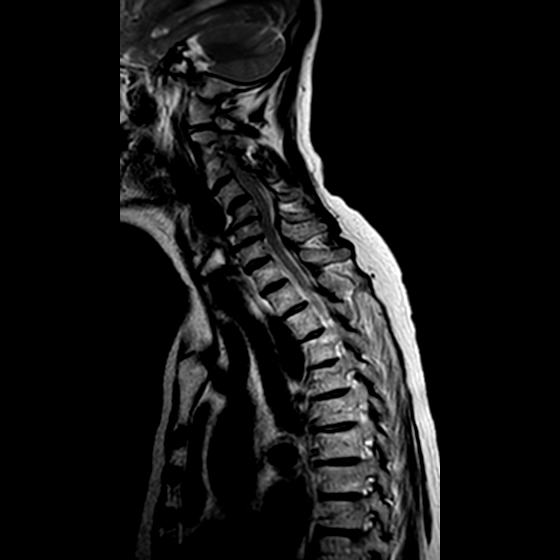

[Series 102: T2 · sagittal · 4.0mm · 0.62mm/px · 2 of 11 slices shown]
[im 1/11]
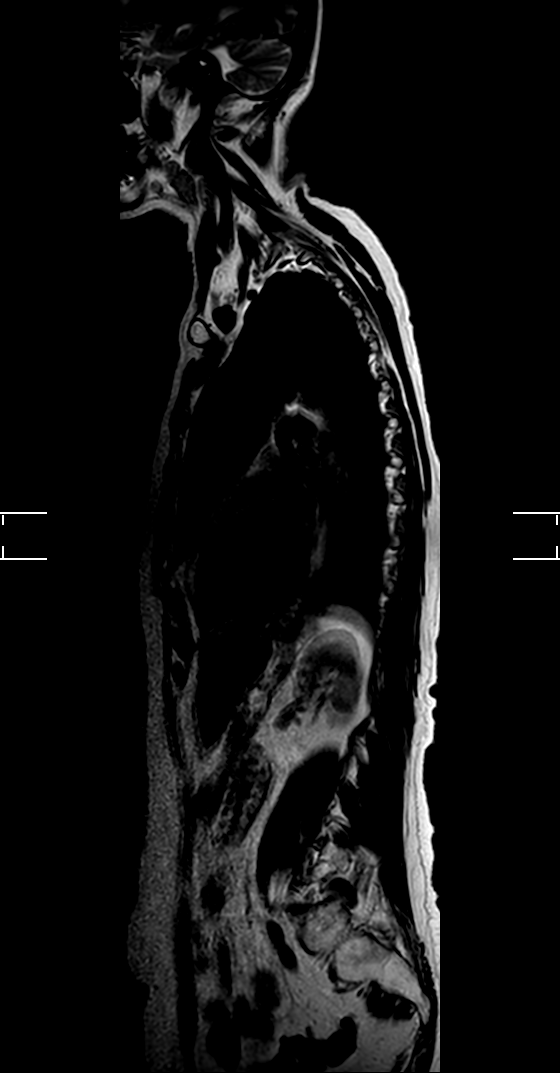
[im 11/11]
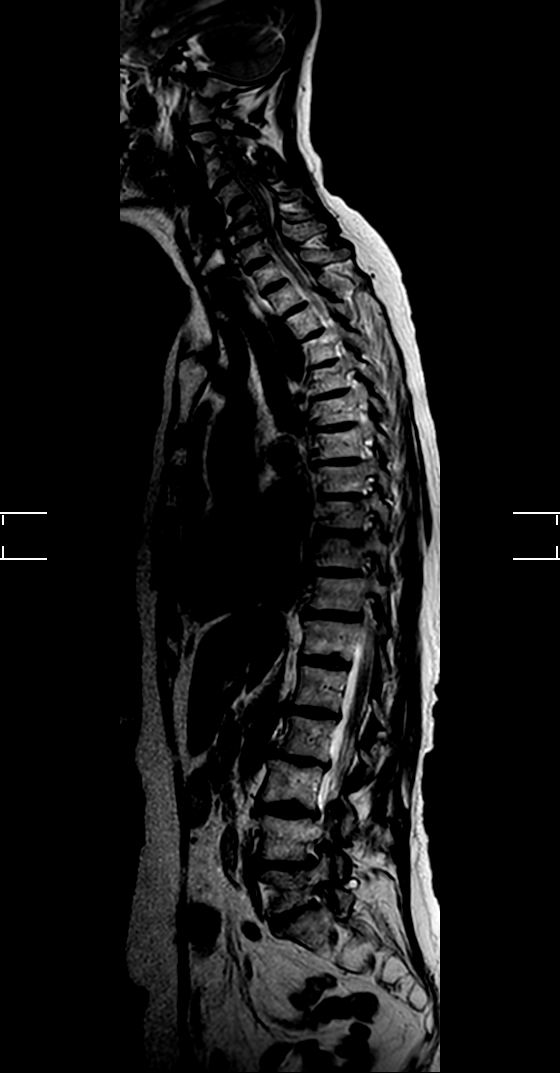

[Series 201: t2_cor_count · coronal · 4.0mm · 0.61mm/px · 2 of 15 slices shown]
[im 1/15]
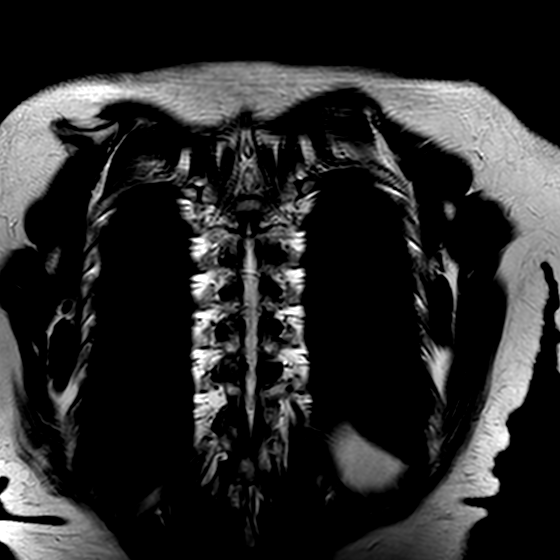
[im 15/15]
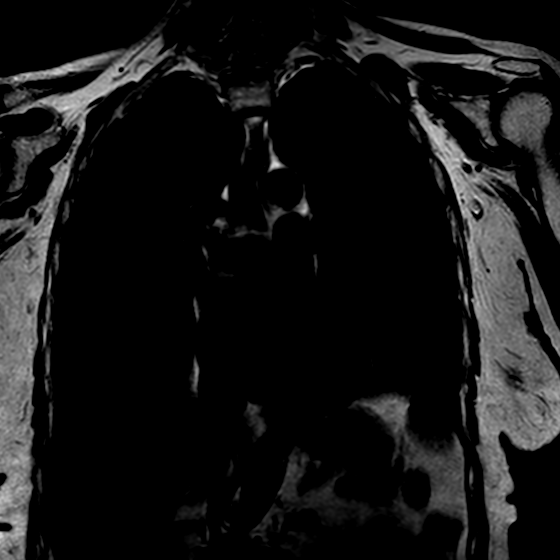

[Series 302: (id)_mdixon_tse · sagittal · 4.0mm · 0.36mm/px · 2 of 17 slices shown]
[im 1/17]
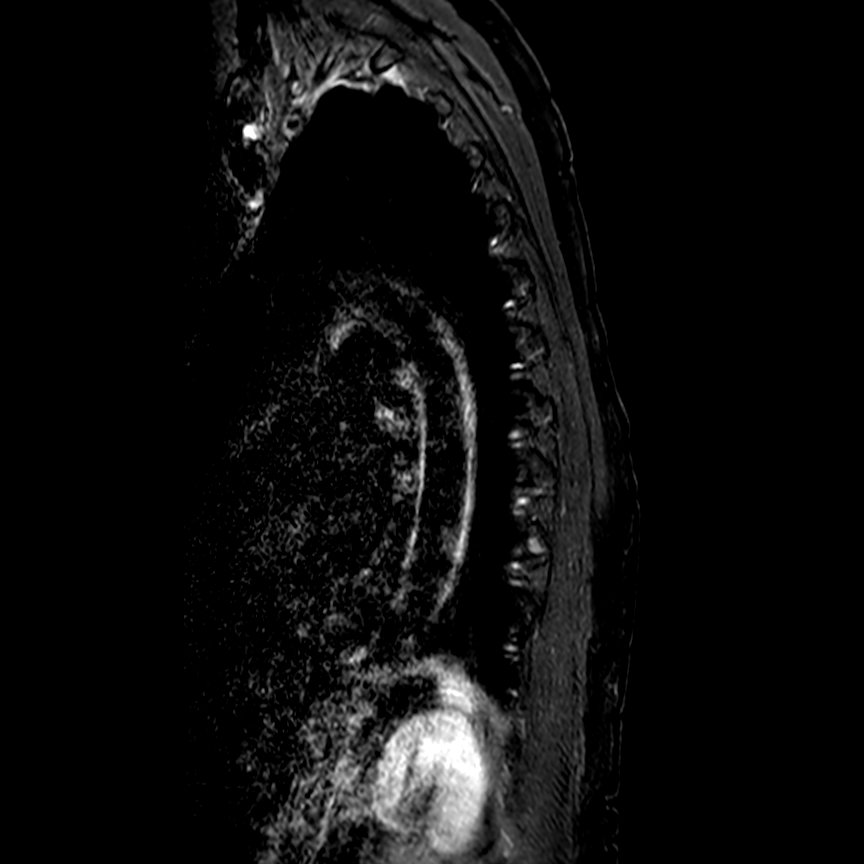
[im 17/17]
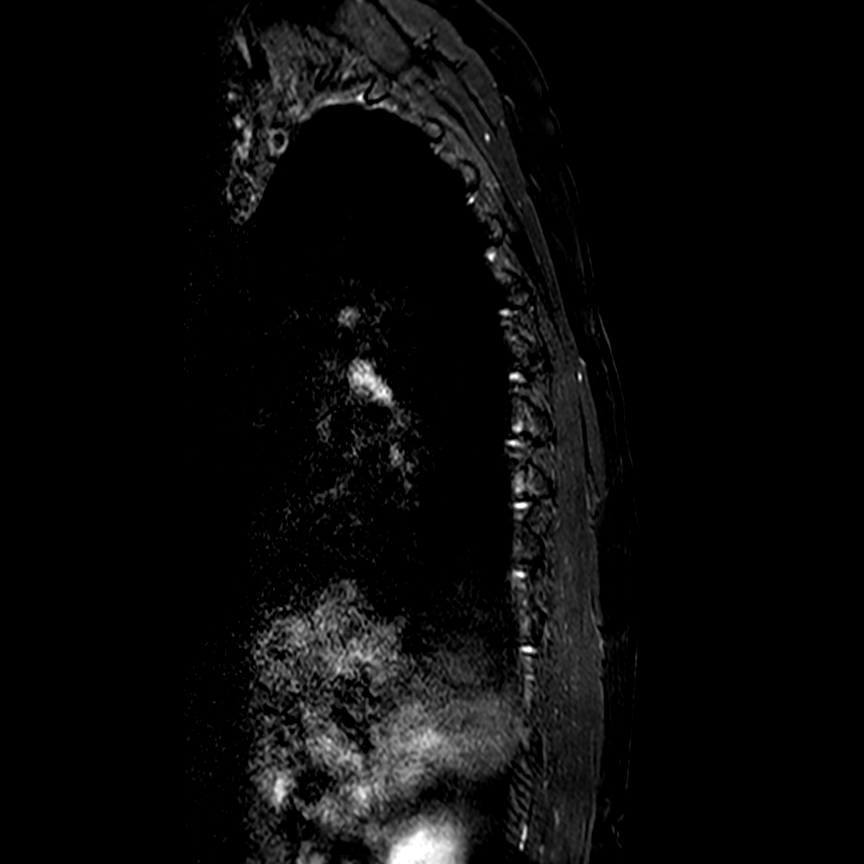

[Series 303: st2w_mdixon_tse · sagittal · 4.0mm · 0.36mm/px · 1 of 17 slices shown]
[im 1/17]
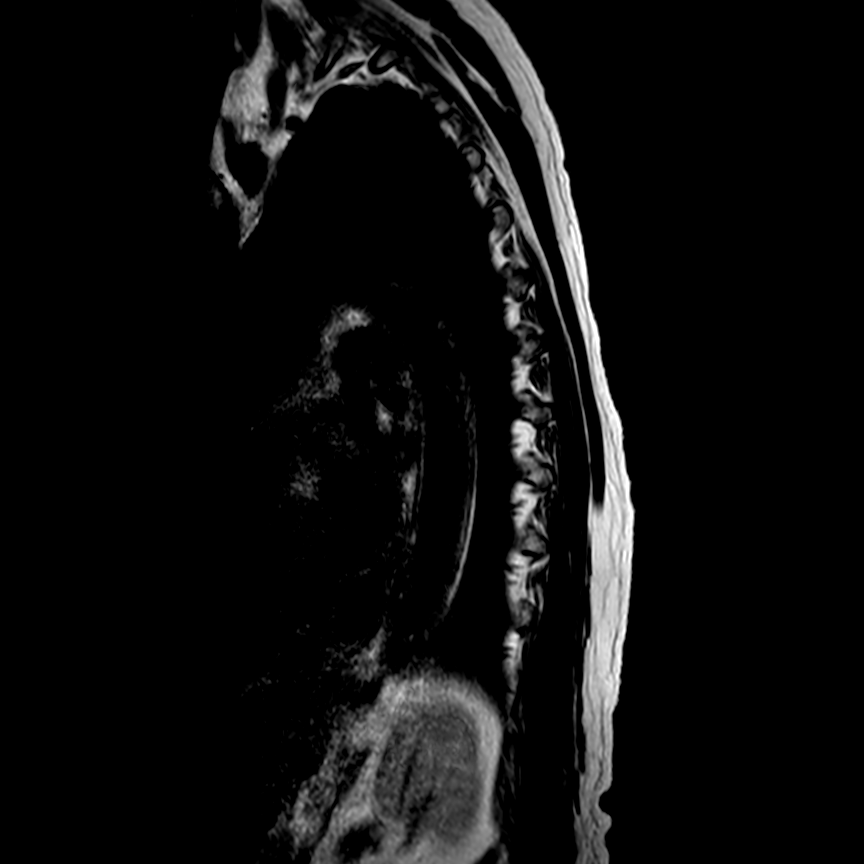

[11 of 48 positions shown; findings below may reference images not displayed]

FINDINGS: -------------------------------------------------------------------------------- 
------ 
GENERAL: 
ALIGNMENT: Mild anterolisthesis of T11 on T12. 
VERTEBRAL BODY HEIGHT: Normal.  
MARROW SIGNAL: No focal suspect signal abnormality. 
CORD SIGNAL: Normal. 
ADDITIONAL FINDINGS: None. 
Segmental analysis reveals multilevel degenerative change without moderate or 
severe central canal or neural foraminal narrowing at any level. No focal disc 
herniation. 
-------------------------------------------------------------------------------- 
------
IMPRESSION: No moderate/severe stenosis at any level. No focal disc herniation.

## 2022-06-15 IMAGING — MR MRI CERVICAL SPINE WITHOUT CONTRAST
5 of 7 series · 21 of 48 positions shown · IV contrast (gadolinium)
Comparison: No prior cervical examination

________________________________________________________________________________________________ 
MRI CERVICAL SPINE WITHOUT CONTRAST, 06/15/2022 [DATE]: 
CLINICAL INDICATION: Neck pain
TECHNIQUE: Sagittal T1, Sagittal T2, Sagittal STIR, Axial TSE and Axial [id number] 
images of the cervical spine were performed without intravenous gadolinium 
enhancement.

[Series 301: t2w_cor-surv · coronal · 5.0mm · 0.69mm/px · 2 of 7 slices shown]
[im 1/7]
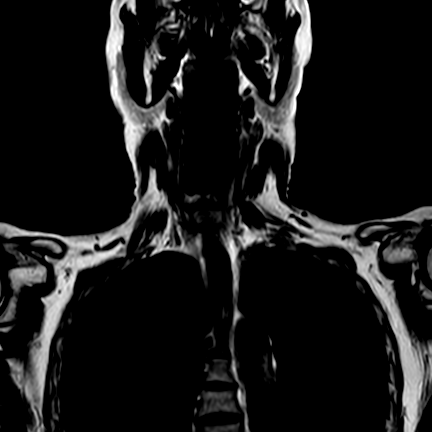
[im 7/7]
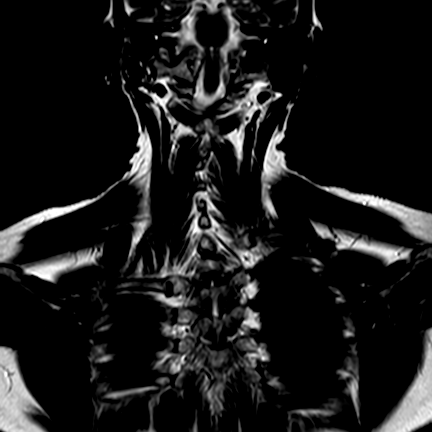

[Series 401: T1 · sagittal · 3.0mm · 0.39mm/px · 5 of 15 slices shown]
[im 1/15]
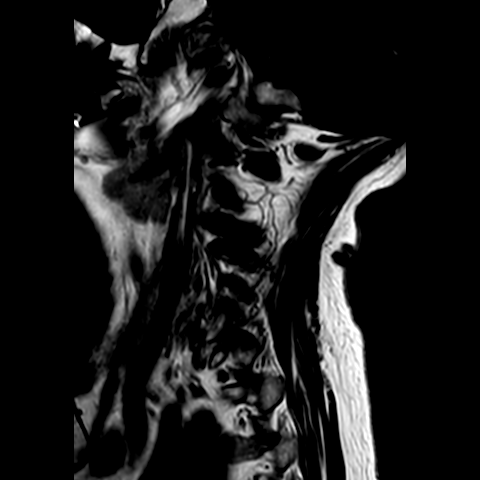
[im 4/15]
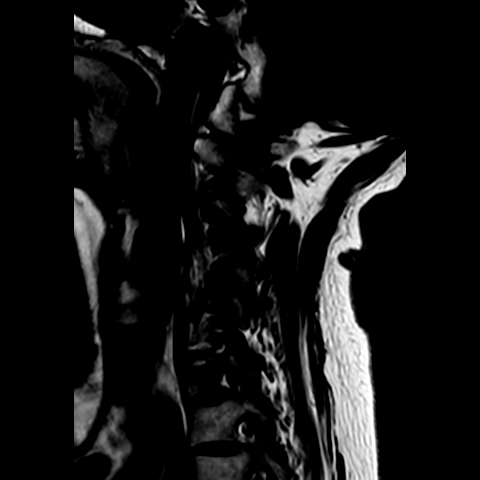
[im 8/15]
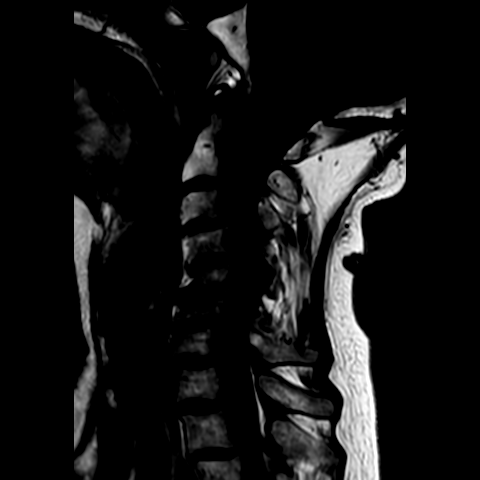
[im 11/15]
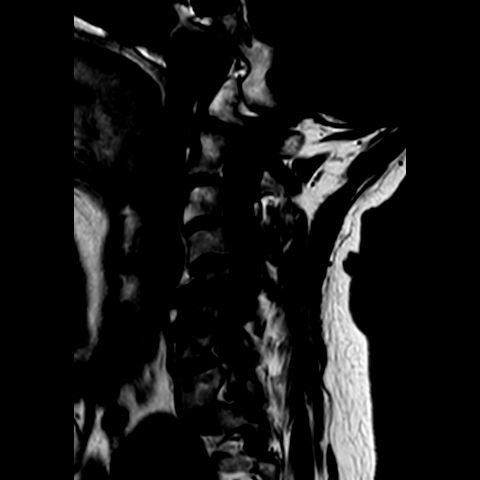
[im 15/15]
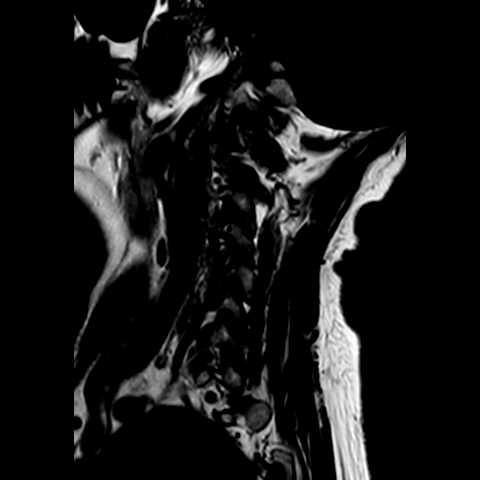

[Series 501: survey · axial · 10.0mm · 1.25mm/px · z∈[-193,-12]mm · 3 of 10 slices shown]
[im 1/10]
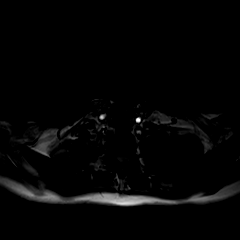
[im 5/10]
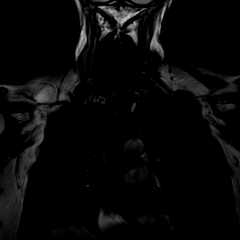
[im 10/10]
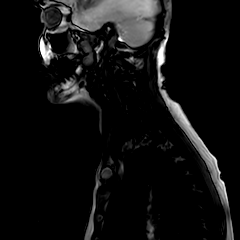

[Series 602: (id)_mdixon_tse · sagittal · 3.0mm · 0.35mm/px · 2 of 15 slices shown]
[im 1/15]
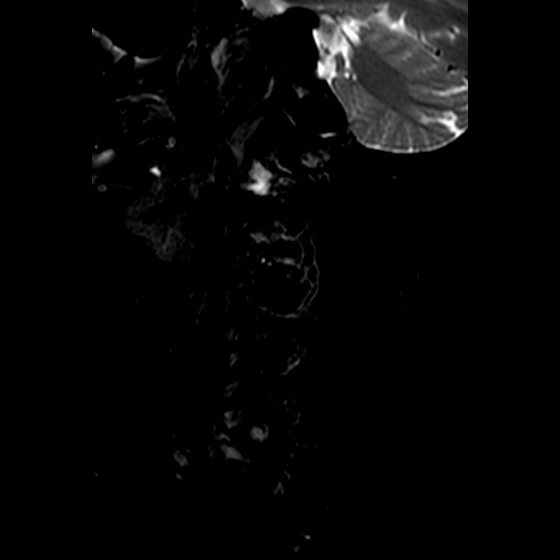
[im 3/15]
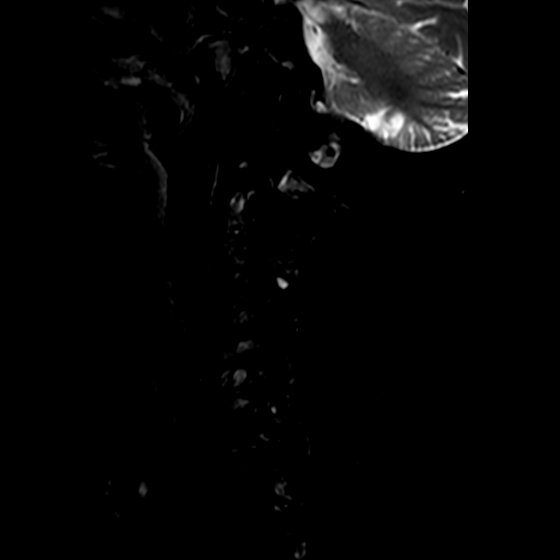

[Series 801: T2 · axial · 3.0mm · 0.31mm/px · z∈[-213,-119]mm · 9 of 34 slices shown]
[im 1/34]
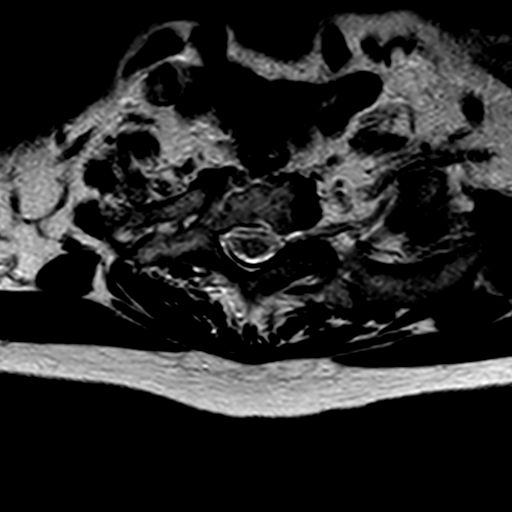
[im 6/34]
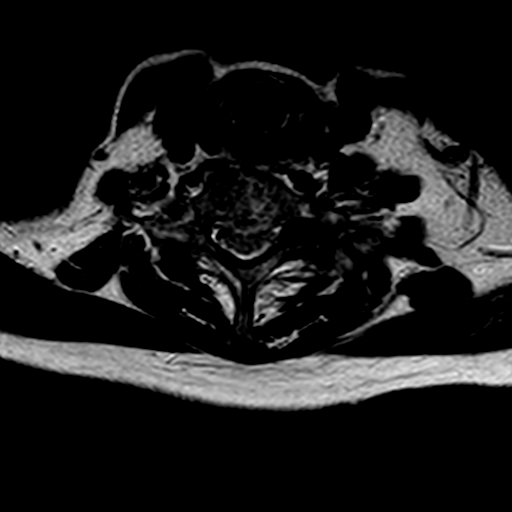
[im 12/34]
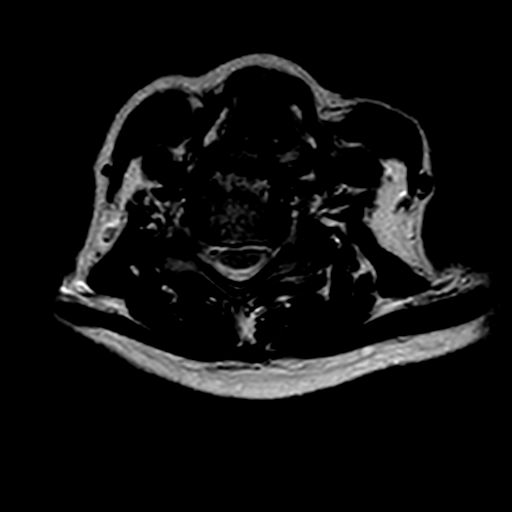
[im 14/34]
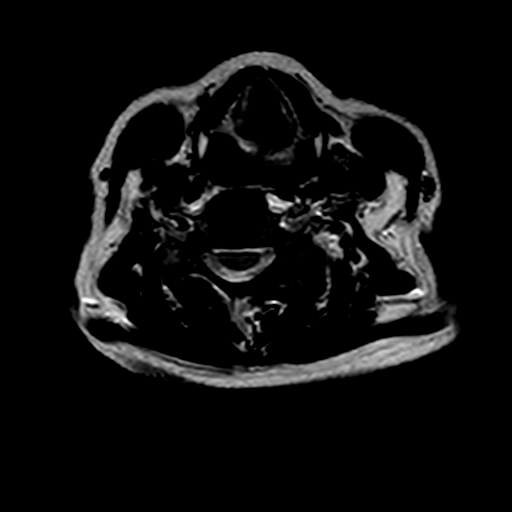
[im 17/34]
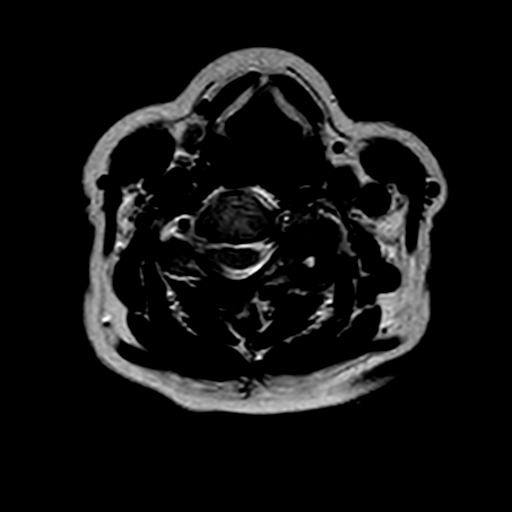
[im 20/34]
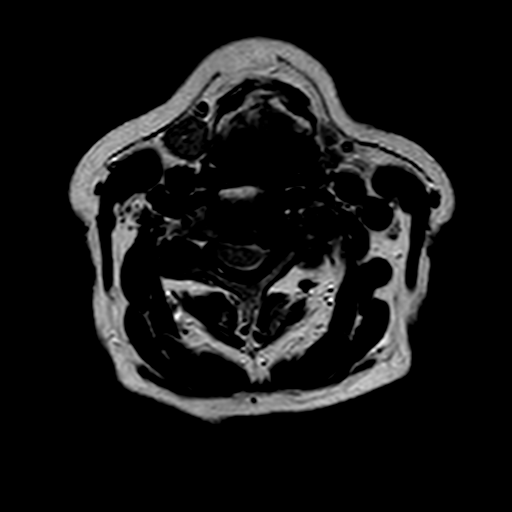
[im 23/34]
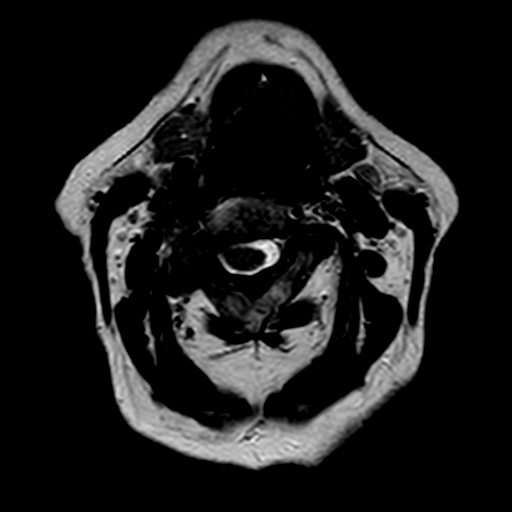
[im 28/34]
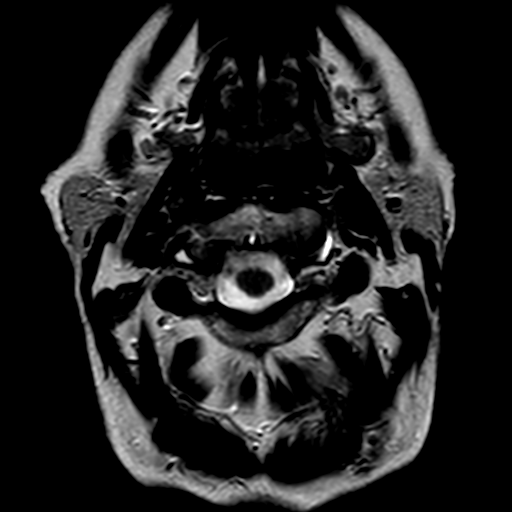
[im 34/34]
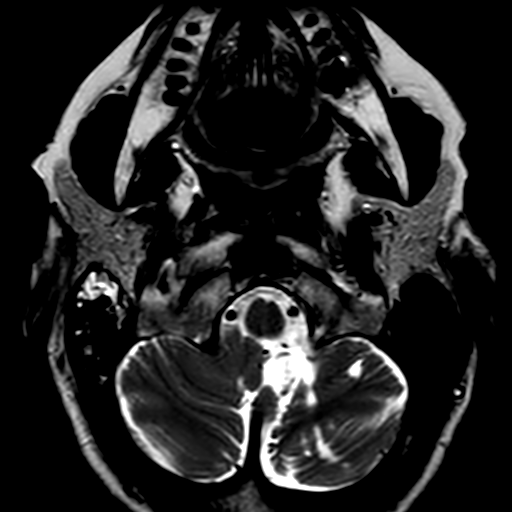

[21 of 48 positions shown; findings below may reference images not displayed]

FINDINGS: Cervical vertebral heights are intact. There is reversal of lordosis. 
There is 1 mm anterolisthesis at C3-4 and C4-5, also T1-2 and C7-T1. 
There is mild motion artifact limiting detail. No cord signal abnormality 
identified. 
There is no evidence for spinal malignancy. The dens is intact. There is a 
degenerative subchondral cyst at the base of the dens. There are atlantoaxial 
and atlantooccipital degenerative changes. There is mild reactive edema 
involving the left atlantooccipital articulation. The craniocervical junction is 
open. 
At C2-3 there is mild disc bulge without cord contact. Detail limited by motion 
artifact. Canal diameter 9 mm. There is no significant foraminal stenosis. 
At C3-4 there is left posterolateral disc-osteophyte mildly deforming the left 
ventral cord. Canal diameter is 5 mm, moderately severe stenosis. There is 
marked left foraminal stenosis impinging the left C4 nerve root. Mild right 
foraminal stenosis. 
At C4-5 the canal and foramina are open. 
At C5-6 there is mild disc-osteophyte, worse on the right. There is mild 
effacement of the right ventral cord. Mid sagittal canal diameter 7.5 mm, 7 mm 
to the right of midline. There is marked right, moderate to marked left 
foraminal stenosis. 
At C6-7 Canal diameter is 8 mm. There is disc-osteophyte touching th ventral e 
cord and dorsal ligamentous thickening touching the dorsal cord. There is 
moderate to marked bilateral foraminal stenosis. 
At C7-T1 the canal is borderline narrow. There is mild left foraminal narrowing, 
right foramen open. 
Modic type I changes C5-6. Reactive edema involves the left C3-4 zygapophyseal 
facet pillar. 
There is mild cervical dextroscoliosis and upper thoracic levoscoliosis. 
The highest axial image shows several small chronic appearing left cerebellar 
infarcts.
IMPRESSION: Spondylosis. Canal stenosis is most pronounced at C3-4, moderately severe, where 
there is cord deformity, and marked left foraminal stenosis impinging the left 
C4 nerve root. There is reactive edema involving the left C3-4 zygapophyseal 
facet pillar. 
Multilevel foraminal stenosis elsewhere as described. 
Modic type I changes at C5-6. 
Mild reversal lordosis could indicate spasm. There is mild cervical 
dextroscoliosis, upper thoracic levoscoliosis. 
Slight degrees of anterolisthesis at C3-4, C4-5, C7-T1 and T1-2. 
Allowing for motion artifact cord signal appears normal. 
Several chronic appearing infarcts in the inferior left cerebellum, largest 
extending at least 17 mm. Dedicated brain MRI would be useful.

## 2022-06-15 IMAGING — MR MRI LUMBAR SPINE WITHOUT CONTRAST
7 of 9 series · 18 of 48 positions shown · IV contrast (gadolinium)
Comparison: Lumbar spine x-rays April 25, 2022.

________________________________________________________________________________________________ 
MRI LUMBAR SPINE WITHOUT CONTRAST, 06/15/2022 [DATE]: 
CLINICAL INDICATION: Neck pain. Mid back pain between shoulders.
TECHNIQUE: Multiplanar, multiecho position MR images of the lumbar spine were 
performed without intravenous gadolinium enhancement. Patient was scanned on a 
1.5 Tesla magnet.

[Series 101: survey · axial · 10.0mm · 1.25mm/px · z∈[-33,+201]mm · 2 of 10 slices shown]
[im 1/10]
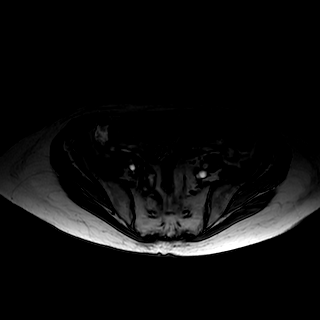
[im 10/10]
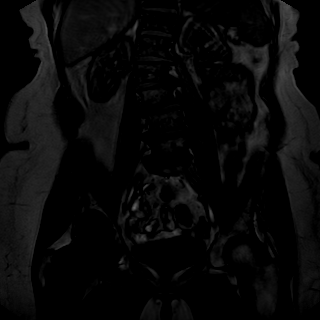

[Series 201: t2w_cor-surv · coronal · 6.0mm · 0.62mm/px · 1 of 10 slices shown]
[im 1/10]
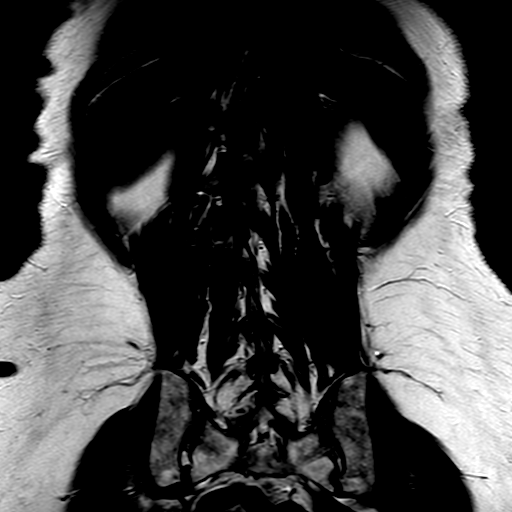

[Series 301: T1 · sagittal · 4.0mm · 0.46mm/px · 2 of 17 slices shown (1 of 2)]
[im 1/17]
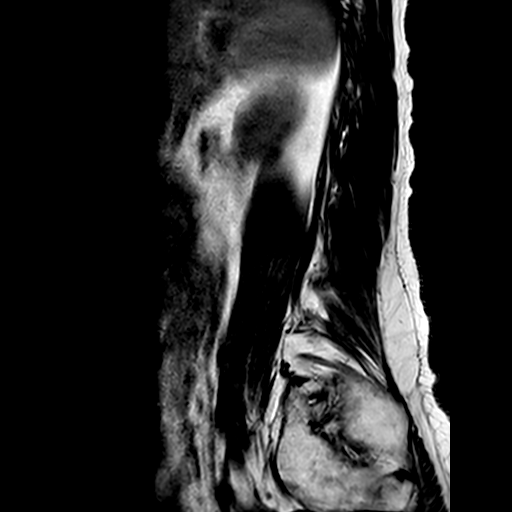
[im 17/17]
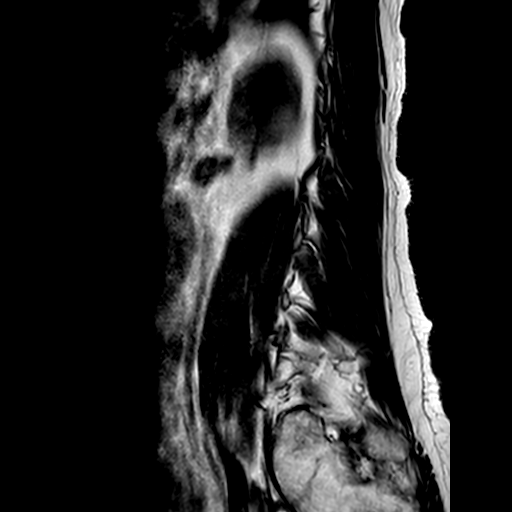

[Series 402: (id)_mdixon_tse · sagittal · 4.0mm · 0.37mm/px · 2 of 17 slices shown]
[im 1/17]
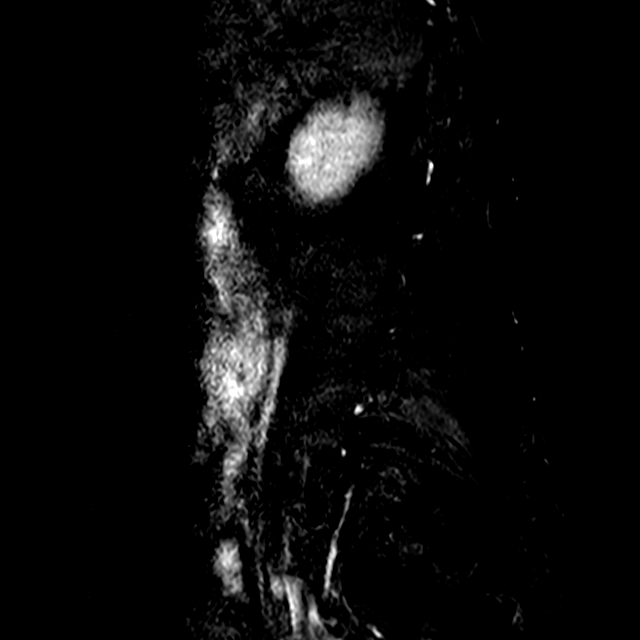
[im 17/17]
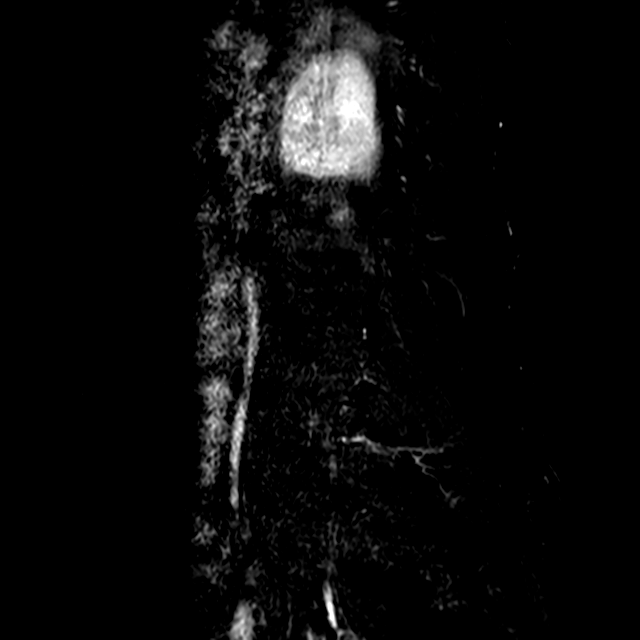

[Series 403: st2w_mdixon_tse · sagittal · 4.0mm · 0.37mm/px · 2 of 17 slices shown]
[im 1/17]
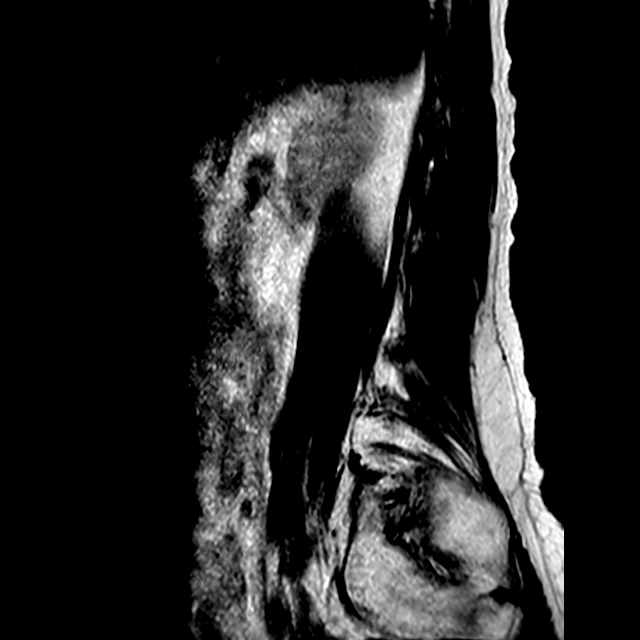
[im 17/17]
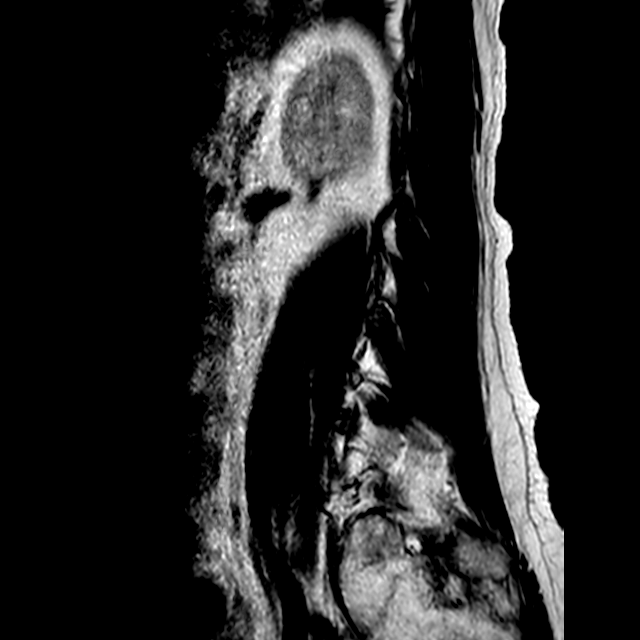

[Series 601: T2 · axial · 4.0mm · 0.30mm/px · z∈[-34,+183]mm · 4 of 30 slices shown]
[im 1/30]
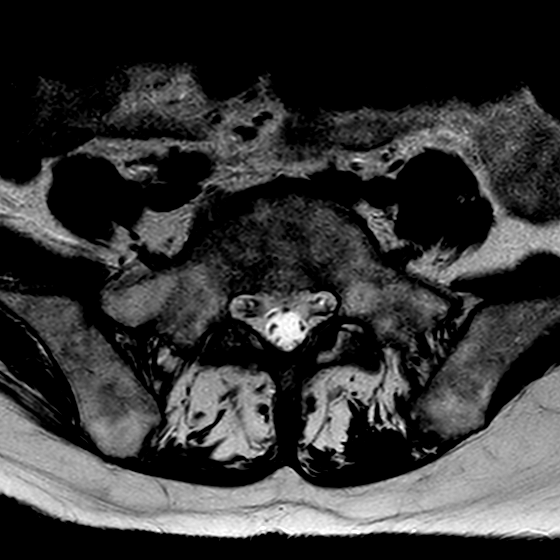
[im 10/30]
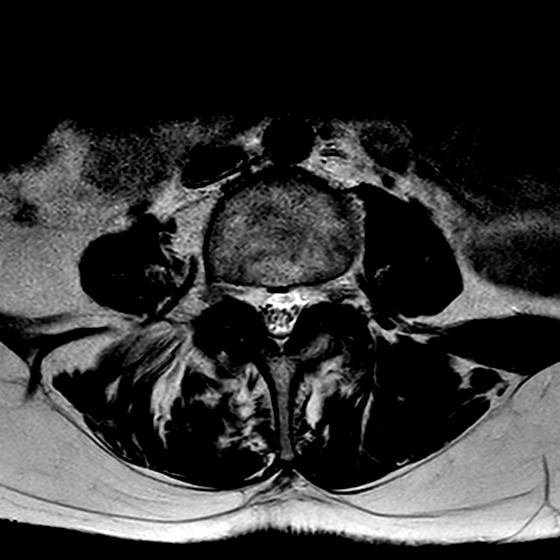
[im 20/30]
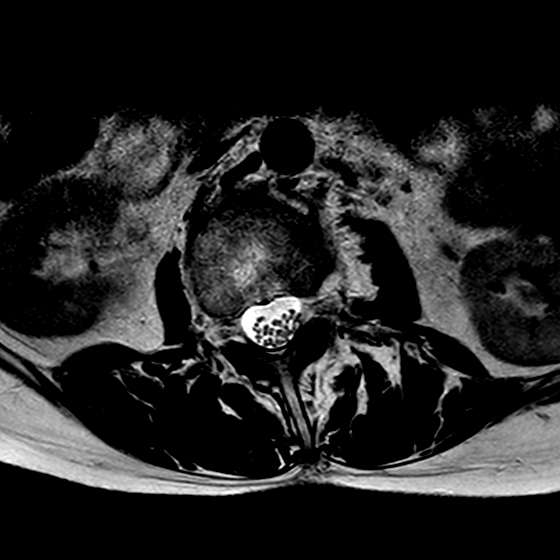
[im 30/30]
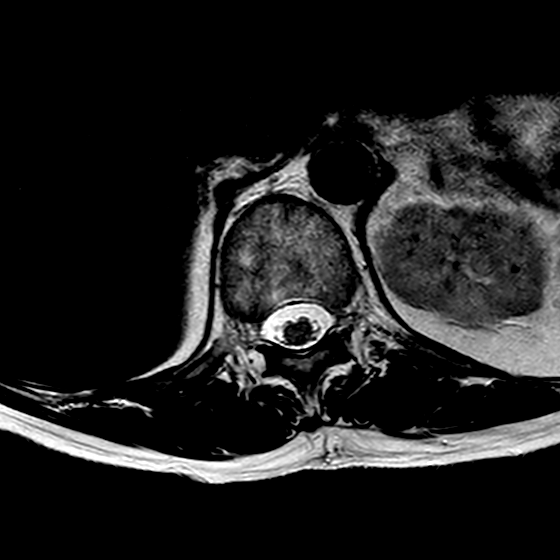

[Series 701: T1 · axial · 4.0mm · 0.39mm/px · z∈[-16,+167]mm · 5 of 42 slices shown (2 of 2)]
[im 1/42]
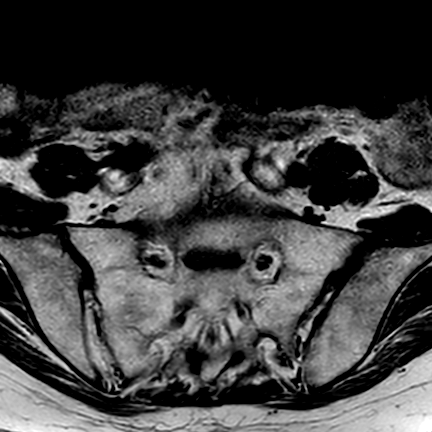
[im 11/42]
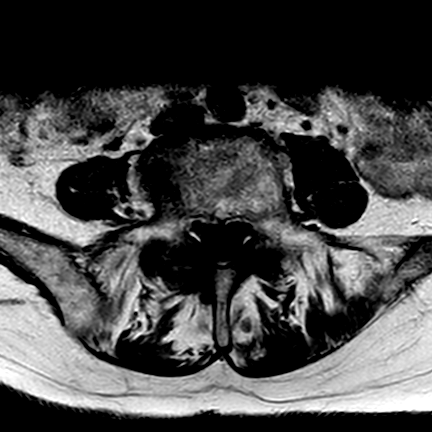
[im 21/42]
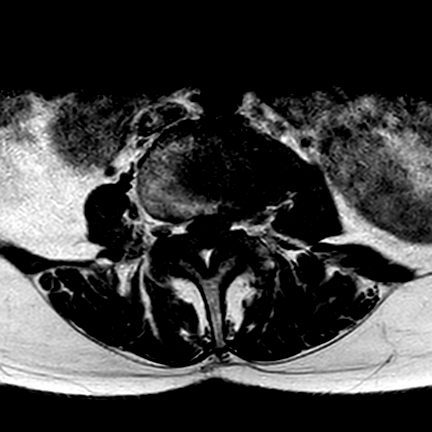
[im 31/42]
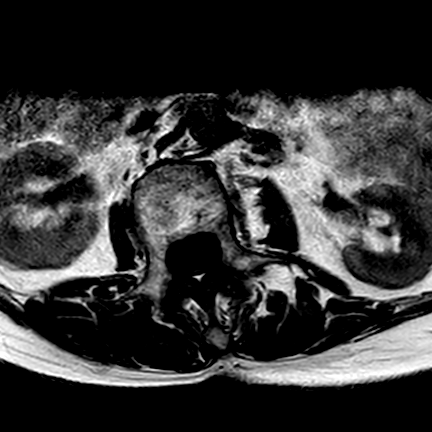
[im 42/42]
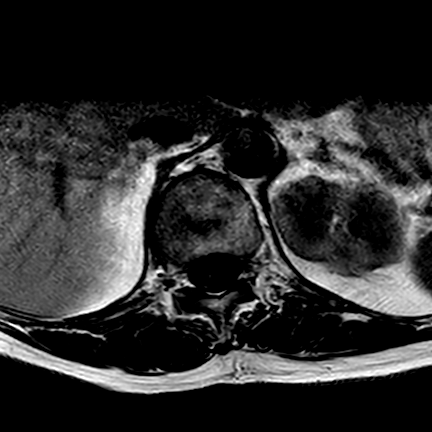

[18 of 48 positions shown; findings below may reference images not displayed]

FINDINGS: -------------------------------------------------------------------------------- 
------ 
GENERAL: 
Nomenclature is based on 5 lumbar type vertebral bodies.     
ALIGNMENT: Dextroconvex lumbar scoliosis apex to the right at the L2 level. 
Slight loss of the normal lumbar lordosis. 
VERTEBRAL BODY HEIGHT: Normal.  
MARROW SIGNAL: No focal suspect signal abnormality. 
CORD SIGNAL: Normal distal spinal cord and cauda equina. Conus medullaris 
terminates at L1. 
ADDITIONAL FINDINGS: None. 
Modic I-II: L2-L3, L3-L4. 
Ligamentum Flavum > 2.5 mm: All levels. 
-------------------------------------------------------------------------------- 
------ 
SEGMENTAL: Mild degenerative change T11-T12. 
T12-L1: Loss of disc signal. Otherwise normal. 
L1-L2: Moderate loss of disc height specifically to the left. Loss of disc 
signal. Minimal annular bulge. Canal patent. Small bilateral facet joint 
effusions. Foramina patent. 
L2-L3: Severe loss of disc height specifically to the left. Loss of disc signal. 
Mild canal stenosis. Facet arthropathy with ligamentum flavum hypertrophy and 
bilateral facet joint effusions. Borderline left foraminal narrowing. Right 
foramen is patent. 
L3-L4: Mild loss of disc height and signal. Annular bulge with mild-to-moderate 
canal stenosis. Facet arthropathy, bilateral facet joint effusions, and 
ligamentum flavum hypertrophy. Mild right foraminal narrowing. Left foramen 
patent. 
L4-L5: Moderate loss of disc height specifically to the right. Loss of disc 
signal. Annular bulge with moderate canal stenosis. Bilateral facet joint 
effusions, facet arthropathy, and ligamentum flavum hypertrophy. Right greater 
than left lateral recess narrowing. Left foramen patent. Mild to moderate right 
foraminal narrowing. 
L5-S1: Loss of disc signal. Canal patent. Facet arthropathy. Mild to moderate 
right foraminal narrowing. Left foramen patent. 
-------------------------------------------------------------------------------- 
------
IMPRESSION: Degenerative and scoliotic changes throughout the lumbar spine. 
No significant canal stenosis is at L3-L4 and L4-L5. Multilevel lateral recess 
and foraminal narrowing as detailed above.

## 2022-07-12 IMAGING — NM THREE PHASE BONE SCAN
1 series · 6 of 6 positions shown · non-contrast
Comparison: 06/26/22 [place] [facility] [place] radiographs

________________________________________________________________________________________________ 
THREE PHASE BONE SCAN, 07/12/2022 [DATE]: 
CLINICAL INDICATION: Left knee loosening prosthesis.
TECHNIQUE: After the intravenous administration of 26.3 mCi of BLAIZE [id number] MDP, 
blood flow, blood pool and delayed images were obtained over the knees.

[Series 1000: flow · 4.80mm/px · 6 of 240 frames shown]
[frame 21/240  full-range]
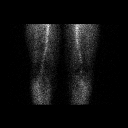
[frame 61/240  full-range]
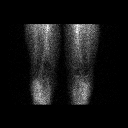
[frame 101/240  full-range]
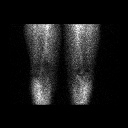
[frame 141/240  full-range]
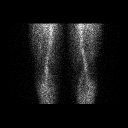
[frame 181/240  full-range]
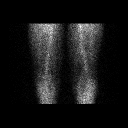
[frame 221/240  full-range]
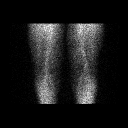

[6 of 6 positions shown; findings below may reference images not displayed]

FINDINGS: Physiologic uptake of radiotracer. Normal flow imaging. Photopenic 
defect in the left knee corresponds to total knee arthroplasty (TKA). Increased 
radiotracer flow about the tibial component of the left TKA, worrisome for 
loosening. Minimal nonspecific radiotracer uptake in the left medial femoral 
condyle. 
Minimal rotation uptake in the medial right knee, corresponding to degenerative 
change on radiographs.
IMPRESSION: Increased radiotracer flow about the tibial component of the left TKA, worrisome 
for loosening.
